# Patient Record
Sex: Female | Born: 1980 | Race: Black or African American | Hispanic: No | Marital: Single | Smoking: Current every day smoker
Health system: Southern US, Community
[De-identification: ages and names within clinical notes are randomized; demographics above are authoritative.]

## PROBLEM LIST (undated history)

## (undated) DIAGNOSIS — Z3490 Encounter for supervision of normal pregnancy, unspecified, unspecified trimester: Secondary | ICD-10-CM

## (undated) DIAGNOSIS — F17200 Nicotine dependence, unspecified, uncomplicated: Secondary | ICD-10-CM

## (undated) DIAGNOSIS — O09529 Supervision of elderly multigravida, unspecified trimester: Secondary | ICD-10-CM

## (undated) DIAGNOSIS — D649 Anemia, unspecified: Secondary | ICD-10-CM

## (undated) HISTORY — DX: Supervision of elderly multigravida, unspecified trimester: O09.529

## (undated) HISTORY — DX: Encounter for supervision of normal pregnancy, unspecified, unspecified trimester: Z34.90

## (undated) HISTORY — DX: Nicotine dependence, unspecified, uncomplicated: F17.200

---

## 2015-11-28 NOTE — L&D Delivery Note (Signed)
Delivery Note At 8:00 AM a viable female was delivered via Vaginal, Spontaneous Delivery (Presentation: ;LOA  ).  APGAR: 8, 9; weight  Pending  After 2 minutes, the cord was clamped and cut. 40 units of pitocin diluted in 1000cc LR was infused rapidly IV.  The placenta separated spontaneously and delivered via CCT and maternal pushing effort.  It was inspected and appears to be intact with a 3 VC.  Marland Kitchen.    Anesthesia: None  Episiotomy:   Lacerations: 1st degree;Vaginal Suture Repair: 2.0 vicryl Est. Blood Loss (mL):  200  Mom to postpartum.  Baby to Couplet care / Skin to Skin.  All of the above by Dr. Lavetta Nielsen Gofa under my supervision  CRESENZO-DISHMAN,Patriece Archbold 05/26/2016, 8:19 AM

## 2016-02-10 ENCOUNTER — Ambulatory Visit (INDEPENDENT_AMBULATORY_CARE_PROVIDER_SITE_OTHER): Payer: Medicaid Other | Admitting: Adult Health

## 2016-02-10 ENCOUNTER — Encounter: Payer: Self-pay | Admitting: Adult Health

## 2016-02-10 VITALS — BP 100/72 | HR 104 | Ht 65.0 in | Wt 139.0 lb

## 2016-02-10 DIAGNOSIS — F17200 Nicotine dependence, unspecified, uncomplicated: Secondary | ICD-10-CM | POA: Insufficient documentation

## 2016-02-10 DIAGNOSIS — N926 Irregular menstruation, unspecified: Secondary | ICD-10-CM | POA: Diagnosis not present

## 2016-02-10 DIAGNOSIS — O09529 Supervision of elderly multigravida, unspecified trimester: Secondary | ICD-10-CM

## 2016-02-10 DIAGNOSIS — Z3201 Encounter for pregnancy test, result positive: Secondary | ICD-10-CM

## 2016-02-10 DIAGNOSIS — Z363 Encounter for antenatal screening for malformations: Secondary | ICD-10-CM

## 2016-02-10 DIAGNOSIS — Z720 Tobacco use: Secondary | ICD-10-CM | POA: Diagnosis not present

## 2016-02-10 DIAGNOSIS — O09523 Supervision of elderly multigravida, third trimester: Secondary | ICD-10-CM

## 2016-02-10 DIAGNOSIS — Z3490 Encounter for supervision of normal pregnancy, unspecified, unspecified trimester: Secondary | ICD-10-CM

## 2016-02-10 HISTORY — DX: Encounter for supervision of normal pregnancy, unspecified, unspecified trimester: Z34.90

## 2016-02-10 HISTORY — DX: Nicotine dependence, unspecified, uncomplicated: F17.200

## 2016-02-10 HISTORY — DX: Supervision of elderly multigravida, unspecified trimester: O09.529

## 2016-02-10 LAB — POCT URINE PREGNANCY: PREG TEST UR: POSITIVE — AB

## 2016-02-10 MED ORDER — PRENATAL PLUS 27-1 MG PO TABS: 1.0000 | ORAL_TABLET | Freq: Every day | ORAL | Status: AC

## 2016-02-10 NOTE — Patient Instructions (Signed)

## 2016-02-10 NOTE — Progress Notes (Signed)
Subjective:     Patient ID: Makayla Colon, female   DOB: Apr 18, 1981, 35 y.o.   MRN: [id number]  HPI Makayla Colon is a 35 year old black female, single, in for UPT, not sure when LMP was, has felt movement since February.Has had 1 PTD and 2 term deliveries. [place] and has no prenatal care.  Review of Systems Patient denies any headaches, hearing loss, fatigue, blurred vision, shortness of breath, chest pain, abdominal pain, problems with bowel movements, urination, or intercourse. No joint pain or mood swings.See HPI for positives. Reviewed past medical,surgical, social and family history. Reviewed medications and allergies.     Objective:   Physical Exam  BP 100/72 mmHg  Pulse 104  Ht 5\' 5"  (1.651 m)  Wt 139 lb (63.05 kg)  BMI 23.13 kg/m2 UPT +, ?lMP, but has felt movement since February, Skin warm and dry. Neck: mid line trachea, normal thyroid, good ROM, no lymphadenopathy noted. Lungs: clear to ausculation bilaterally. Cardiovascular: regular rate and rhythm.FH 30 cm and FHR 145 by doppler, US shows active baby, with +FHM and is a boy, femur length 4.5 cm, has 3 vessel cord and good fluid volume,will get back in am for anatomy and dating US. Decrease cigarettes.    Assessment:     UPT + Pregnant Smoker AMA  Late presentation     Plan:     Rx prenatal plus #30 take 1 daily with 11 refills Return in 1 day for anatomy and dating US Review handout on third trimester

## 2016-02-11 ENCOUNTER — Ambulatory Visit (INDEPENDENT_AMBULATORY_CARE_PROVIDER_SITE_OTHER): Payer: Medicaid Other

## 2016-02-11 DIAGNOSIS — O321XX1 Maternal care for breech presentation, fetus 1: Secondary | ICD-10-CM

## 2016-02-11 DIAGNOSIS — Z363 Encounter for antenatal screening for malformations: Secondary | ICD-10-CM

## 2016-02-11 DIAGNOSIS — Z3A25 25 weeks gestation of pregnancy: Secondary | ICD-10-CM

## 2016-02-11 DIAGNOSIS — Z36 Encounter for antenatal screening of mother: Secondary | ICD-10-CM | POA: Diagnosis not present

## 2016-02-11 NOTE — Progress Notes (Signed)
US 24+5 wks,efw 728g,svp of fluid 4.5cm,normal ov's bilat,breech,fhr 140 bpm,cx 3.8 cm,left lateral pl,anatomy complete no obvious abn seen

## 2016-02-23 ENCOUNTER — Encounter: Payer: Self-pay | Admitting: Advanced Practice Midwife

## 2016-02-23 ENCOUNTER — Ambulatory Visit (INDEPENDENT_AMBULATORY_CARE_PROVIDER_SITE_OTHER): Payer: Medicaid Other | Admitting: Advanced Practice Midwife

## 2016-02-23 ENCOUNTER — Other Ambulatory Visit (HOSPITAL_COMMUNITY)
Admission: RE | Admit: 2016-02-23 | Discharge: 2016-02-23 | Disposition: A | Discharge status: Home / Self Care | Payer: Medicaid Other | Source: Ambulatory Visit | Attending: Advanced Practice Midwife | Admitting: Advanced Practice Midwife

## 2016-02-23 VITALS — BP 92/68 | HR 108 | Wt 140.5 lb

## 2016-02-23 DIAGNOSIS — Z0283 Encounter for blood-alcohol and blood-drug test: Secondary | ICD-10-CM

## 2016-02-23 DIAGNOSIS — Z3492 Encounter for supervision of normal pregnancy, unspecified, second trimester: Secondary | ICD-10-CM

## 2016-02-23 DIAGNOSIS — Z1389 Encounter for screening for other disorder: Secondary | ICD-10-CM

## 2016-02-23 DIAGNOSIS — Z3490 Encounter for supervision of normal pregnancy, unspecified, unspecified trimester: Secondary | ICD-10-CM | POA: Insufficient documentation

## 2016-02-23 DIAGNOSIS — Z01419 Encounter for gynecological examination (general) (routine) without abnormal findings: Secondary | ICD-10-CM | POA: Diagnosis present

## 2016-02-23 DIAGNOSIS — Z1151 Encounter for screening for human papillomavirus (HPV): Secondary | ICD-10-CM | POA: Diagnosis present

## 2016-02-23 DIAGNOSIS — Z369 Encounter for antenatal screening, unspecified: Secondary | ICD-10-CM

## 2016-02-23 DIAGNOSIS — O09523 Supervision of elderly multigravida, third trimester: Secondary | ICD-10-CM

## 2016-02-23 DIAGNOSIS — Z331 Pregnant state, incidental: Secondary | ICD-10-CM

## 2016-02-23 LAB — POCT URINALYSIS DIPSTICK
KETONES UA: NEGATIVE
Leukocytes, UA: NEGATIVE
Nitrite, UA: NEGATIVE
RBC UA: NEGATIVE

## 2016-02-23 NOTE — Patient Instructions (Signed)
Safe Medications in Pregnancy   Acne: Benzoyl Peroxide Salicylic Acid  Backache/Headache: Tylenol: 2 regular strength every 4 hours OR              2 Extra strength every 6 hours  Colds/Coughs/Allergies: Benadryl (alcohol free) 25 mg every 6 hours as needed Breath right strips Claritin Cepacol throat lozenges Chloraseptic throat spray Cold-Eeze- up to three times per day Cough drops, alcohol free Flonase (by prescription only) Guaifenesin Mucinex Robitussin DM (plain only, alcohol free) Saline nasal spray/drops Sudafed (pseudoephedrine) & Actifed ** use only after [redacted] weeks gestation and if you do not have high blood pressure Tylenol Vicks Vaporub Zinc lozenges Zyrtec   Constipation: Colace Ducolax suppositories Fleet enema Glycerin suppositories Metamucil Milk of magnesia Miralax Senokot Smooth move tea  Diarrhea: Kaopectate Imodium A-D  *NO pepto Bismol  Hemorrhoids: Anusol Anusol HC Preparation H Tucks  Indigestion: Tums Maalox Mylanta Zantac  Pepcid  Insomnia: Benadryl (alcohol free) 25mg every 6 hours as needed Tylenol PM Unisom, no Gelcaps  Leg Cramps: Tums MagGel  Nausea/Vomiting:  Bonine Dramamine Emetrol Ginger extract Sea bands Meclizine  Nausea medication to take during pregnancy:  Unisom (doxylamine succinate 25 mg tablets) Take one tablet daily at bedtime. If symptoms are not adequately controlled, the dose can be increased to a maximum recommended dose of two tablets daily (1/2 tablet in the morning, 1/2 tablet mid-afternoon and one at bedtime). Vitamin B6 100mg tablets. Take one tablet twice a day (up to 200 mg per day).  Skin Rashes: Aveeno products Benadryl cream or 25mg every 6 hours as needed Calamine Lotion 1% cortisone cream  Yeast infection: Gyne-lotrimin 7 Monistat 7   **If taking multiple medications, please check labels to avoid duplicating the same active ingredients **take medication as directed on  the label ** Do not exceed 4000 mg of tylenol in 24 hours **Do not take medications that contain aspirin or ibuprofen   1. Before your test, do not eat or drink anything for 8-10 hours prior to your  appointment (a small amount of water is allowed and you may take any medicines you normally take). Be sure to drink lots of water the day before. 2. When you arrive, your blood will be drawn for a 'fasting' blood sugar level.  Then you will be given a sweetened carbonated beverage to drink. You should  complete drinking this beverage within five minutes. After finishing the  beverage, you will have your blood drawn exactly 1 and 2 hours later. Having  your blood drawn on time is an important part of this test. A total of three blood  samples will be done. 3. The test takes approximately 2  hours. During the test, do not have anything to  eat or drink. Do not smoke, chew gum (not even sugarless gum) or use breath mints.  4. During the test you should remain close by and seated as much as possible and  avoid walking around. You may want to bring a book or something else to  occupy your time.  5. After your test, you may eat and drink as normal. You may want to bring a snack  to eat after the test is finished. Your provider will advise you as to the results of  this test and any follow-up if necessary  If your sugar test is positive for gestational diabetes, you will be given an phone call and further instructions discussed. If you wish to know all of your test results before your next   next appointment, feel free to call the office, or look up your test results on Mychart.  (The range that the lab uses for normal values of the sugar test are not necessarily the range that is used for pregnant women; if your results are within the normal range, they are definitely normal.  However, if a value is deemed "high" by the lab, it may not be too high for a pregnant woman.  We will need to discuss the results if  your value(s) fall in the "high" category).     Tdap Vaccine  It is recommended that you get the Tdap vaccine during the third trimester of EACH pregnancy to help protect your baby from getting pertussis (whooping cough)  27-36 weeks is the BEST time to do this so that you can pass the protection on to your baby. During pregnancy is better than after pregnancy, but if you are unable to get it during pregnancy it will be offered at the hospital.  You can get this vaccine at the health department or your family doctor, as well as some pharmacies.  Everyone who will be around your baby should also be up-to-date on their vaccines. Adults (who are not pregnant) only need 1 dose of Tdap during adulthood.      

## 2016-02-23 NOTE — Progress Notes (Signed)
Subjective:    Makayla Colon is a [id number] 3555w3d being seen today for her first oMarja Kaysbstetrical visit.  Her obstetrical history is significant for intrauterine growth restriction (IUGR), smoker and PTD @ 32 weeks after PPROM w/ 1st baby.  Too late for 17p.  Pregnancy history fully reviewed.  Patient reports some hip pain, but otherwise feels OK.  She did not know she was pregnat until recently.  [place] to help mom w/ aging grandparents.  FOB is b/t 2 different Delta men.Ceasar Mons.  Filed Vitals:   02/23/16 1116  BP: 92/68  Pulse: 108  Weight: 140 lb 8 oz (63.73 kg)    HISTORY: OB History  Gravida Para Term Preterm AB SAB TAB Ectopic Multiple Living  7 3 2 1 3 2 1  0 0 3    # Outcome Date GA Lbr Len/2nd Weight Sex Delivery Anes PTL Lv  7 Current           6 Term 02/03/13   5 lb 4 oz (2.381 kg) F   N Y  5 Term 10/13/05 8554w0d  6 lb 7 oz (2.92 kg) F Vag-Spont  N Y  4 Preterm 12/13/02 8448w0d  3 lb 1 oz (1.389 kg) M   Y Y  3 SAB           2 SAB           1 TAB              Past Medical History  Diagnosis Date  . Pregnant 02/10/2016  . Smoker 02/10/2016  . AMA (advanced maternal age) multigravida 35+ 02/10/2016   History reviewed. No pertinent past surgical history. Family History  Problem Relation Age of Onset  . Hypertension Mother   . Cancer Mother   . Asthma Son   . Cancer Maternal Grandmother   . Other Maternal Grandmother     blood clots  . Hypertension Maternal Grandmother   . Cancer Maternal Grandfather      Exam       Pelvic Exam:    Perineum: Normal Perineum   Vulva: normal   Vagina:  normal mucosa, normal discharge, no palpable nodules   Uterus Normal, Gravid, FH: 23     Cervix: normal   Adnexa: Not palpable   Urinary:  urethral meatus normal    System:     Skin: normal coloration and turgor, no rashes.  Large hairy nevis on right forearm    Neurologic: oriented, normal, normal mood   Extremities: normal strength, tone, and muscle mass    HEENT PERRLA   Mouth/Teeth mucous membranes moist, normal dentition   Neck supple and no masses   Cardiovascular: regular rate and rhythm   Respiratory:  appears well, vitals normal, no respiratory distress, acyanotic   Abdomen: soft, non-tender;  FHR: 130          Assessment:    Pregnancy: [id number] Patient Active Problem List   Diagnosis Date Noted  . Supervision of normal pregnancy 02/23/2016  . Pregnant 02/10/2016  . Smoker 02/10/2016  . AMA (advanced maternal age) multigravida 35+ 02/10/2016        Plan:     Initial labs drawn. Continue prenatal vitamins  Problem list reviewed and updated  Reviewed n/v relief measures and warning s/s to report  Reviewed recommended weight gain based on pre-gravid BMI  Encouraged well-balanced diet Genetic Screening discussed too late: Marland Kitchen.  Ultrasound discussed; fetal survey: results reviewed.  Return in about 2 weeks (  around 03/08/2016) for PN2/LROB.  CRESENZO-DISHMAN,Makayla Colon 02/23/2016

## 2016-02-24 ENCOUNTER — Telehealth: Payer: Self-pay | Admitting: *Deleted

## 2016-02-24 LAB — URINE CULTURE: ORGANISM ID, BACTERIA: NO GROWTH

## 2016-02-24 LAB — CBC
Hematocrit: 33.2 % — ABNORMAL LOW (ref 34.0–46.6)
Hemoglobin: 11 g/dL — ABNORMAL LOW (ref 11.1–15.9)
MCH: 30.8 pg (ref 26.6–33.0)
MCHC: 33.1 g/dL (ref 31.5–35.7)
MCV: 93 fL (ref 79–97)
Platelets: 246 10*3/uL (ref 150–379)
RBC: 3.57 x10E6/uL — ABNORMAL LOW (ref 3.77–5.28)
RDW: 15 % (ref 12.3–15.4)
WBC: 12 10*3/uL — ABNORMAL HIGH (ref 3.4–10.8)

## 2016-02-24 LAB — RPR: RPR: NONREACTIVE

## 2016-02-24 LAB — PMP SCREEN PROFILE (10S), URINE
Amphetamine Screen, Ur: NEGATIVE ng/mL
BARBITURATE SCRN UR: NEGATIVE ng/mL
BENZODIAZEPINE SCREEN, URINE: NEGATIVE ng/mL
CANNABINOIDS UR QL SCN: NEGATIVE ng/mL
Cocaine(Metab.)Screen, Urine: NEGATIVE ng/mL
Creatinine(Crt), U: 147.8 mg/dL (ref 20.0–300.0)
Methadone Scn, Ur: NEGATIVE ng/mL
OPIATE SCRN UR: NEGATIVE ng/mL
OXYCODONE+OXYMORPHONE UR QL SCN: NEGATIVE ng/mL
PCP Scrn, Ur: NEGATIVE ng/mL
Ph of Urine: 5.7 (ref 4.5–8.9)
Propoxyphene, Screen: NEGATIVE ng/mL

## 2016-02-24 LAB — URINALYSIS, ROUTINE W REFLEX MICROSCOPIC
Bilirubin, UA: NEGATIVE
Ketones, UA: NEGATIVE
Leukocytes, UA: NEGATIVE
NITRITE UA: NEGATIVE
PH UA: 6 (ref 5.0–7.5)
RBC, UA: NEGATIVE
Specific Gravity, UA: 1.028 (ref 1.005–1.030)
UUROB: 0.2 mg/dL (ref 0.2–1.0)

## 2016-02-24 LAB — ANTIBODY SCREEN: ANTIBODY SCREEN: NEGATIVE

## 2016-02-24 LAB — HIV ANTIBODY (ROUTINE TESTING W REFLEX): HIV SCREEN 4TH GENERATION: NONREACTIVE

## 2016-02-24 LAB — VARICELLA ZOSTER ANTIBODY, IGG: Varicella zoster IgG: 349 index (ref >165)

## 2016-02-24 LAB — ABO/RH: RH TYPE: POSITIVE

## 2016-02-24 LAB — HEPATITIS B SURFACE ANTIGEN: HEP B S AG: NEGATIVE

## 2016-02-24 LAB — CYTOLOGY - PAP

## 2016-02-24 LAB — RUBELLA SCREEN: RUBELLA: 1.4 {index} (ref >0.99)

## 2016-02-24 LAB — SICKLE CELL SCREEN: SICKLE CELL SCREEN: NEGATIVE

## 2016-02-24 NOTE — Telephone Encounter (Signed)
Opened phone encounter in error 

## 2016-02-24 NOTE — Telephone Encounter (Signed)
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 Informed pt due date is 05/28/2016, gestational age 37 w 4 d.

## 2016-03-08 ENCOUNTER — Ambulatory Visit (INDEPENDENT_AMBULATORY_CARE_PROVIDER_SITE_OTHER): Payer: Medicaid Other | Admitting: Women's Health

## 2016-03-08 ENCOUNTER — Encounter: Payer: Self-pay | Admitting: Women's Health

## 2016-03-08 ENCOUNTER — Other Ambulatory Visit: Payer: Medicaid Other

## 2016-03-08 VITALS — BP 100/60 | HR 90 | Wt 142.0 lb

## 2016-03-08 DIAGNOSIS — O09899 Supervision of other high risk pregnancies, unspecified trimester: Secondary | ICD-10-CM | POA: Insufficient documentation

## 2016-03-08 DIAGNOSIS — Z369 Encounter for antenatal screening, unspecified: Secondary | ICD-10-CM

## 2016-03-08 DIAGNOSIS — Z331 Pregnant state, incidental: Secondary | ICD-10-CM

## 2016-03-08 DIAGNOSIS — O0930 Supervision of pregnancy with insufficient antenatal care, unspecified trimester: Secondary | ICD-10-CM

## 2016-03-08 DIAGNOSIS — O09893 Supervision of other high risk pregnancies, third trimester: Secondary | ICD-10-CM

## 2016-03-08 DIAGNOSIS — O09523 Supervision of elderly multigravida, third trimester: Secondary | ICD-10-CM

## 2016-03-08 DIAGNOSIS — Z1389 Encounter for screening for other disorder: Secondary | ICD-10-CM

## 2016-03-08 DIAGNOSIS — Z3493 Encounter for supervision of normal pregnancy, unspecified, third trimester: Secondary | ICD-10-CM

## 2016-03-08 DIAGNOSIS — O09213 Supervision of pregnancy with history of pre-term labor, third trimester: Secondary | ICD-10-CM

## 2016-03-08 DIAGNOSIS — O09219 Supervision of pregnancy with history of pre-term labor, unspecified trimester: Secondary | ICD-10-CM

## 2016-03-08 DIAGNOSIS — Z8759 Personal history of other complications of pregnancy, childbirth and the puerperium: Secondary | ICD-10-CM

## 2016-03-08 LAB — POCT URINALYSIS DIPSTICK
GLUCOSE UA: NEGATIVE
Ketones, UA: NEGATIVE
Leukocytes, UA: NEGATIVE
Nitrite, UA: NEGATIVE
Protein, UA: NEGATIVE
RBC UA: NEGATIVE

## 2016-03-08 NOTE — Progress Notes (Addendum)
Low-risk OB appointment [id number] 5143w3d Estimated Date of Delivery: 05/28/16 Wt 142 lb (64.411 kg)  LMP  (LMP Unknown)  BP, weight, and urine reviewed.  Refer to obstetrical flow sheet for FH & FHR.  Reports good fm.  Denies regular uc's, lof, vb, or uti s/s. No complaints. Wants in-hospital BTL, declines interval salpingectomy. Discussed risks/benefits, consent signed today.  Reviewed ptl s/s, fkc. Recommended Tdap at HD/PCP per CDC guidelines.  Plan:  Continue routine obstetrical care  F/U in 4wks for OB appointment and bpp/efw/afi u/s for ama, h/o iugr PN2 today

## 2016-03-08 NOTE — Patient Instructions (Signed)

## 2016-03-09 LAB — CBC
HEMATOCRIT: 33.3 % — AB (ref 34.0–46.6)
HEMOGLOBIN: 11.3 g/dL (ref 11.1–15.9)
MCH: 32 pg (ref 26.6–33.0)
MCHC: 33.9 g/dL (ref 31.5–35.7)
MCV: 94 fL (ref 79–97)
Platelets: 230 10*3/uL (ref 150–379)
RBC: 3.53 x10E6/uL — AB (ref 3.77–5.28)
RDW: 15 % (ref 12.3–15.4)
WBC: 12.1 10*3/uL — ABNORMAL HIGH (ref 3.4–10.8)

## 2016-03-09 LAB — RPR: RPR: NONREACTIVE

## 2016-03-09 LAB — HIV ANTIBODY (ROUTINE TESTING W REFLEX): HIV SCREEN 4TH GENERATION: NONREACTIVE

## 2016-03-09 LAB — GLUCOSE TOLERANCE, 2 HOURS W/ 1HR
GLUCOSE, 1 HOUR: 146 mg/dL (ref 65–179)
GLUCOSE, 2 HOUR: 111 mg/dL (ref 65–152)
Glucose, Fasting: 84 mg/dL (ref 65–91)

## 2016-03-09 LAB — ANTIBODY SCREEN: Antibody Screen: NEGATIVE

## 2016-04-06 ENCOUNTER — Ambulatory Visit (INDEPENDENT_AMBULATORY_CARE_PROVIDER_SITE_OTHER): Payer: Medicaid Other

## 2016-04-06 ENCOUNTER — Encounter: Payer: Self-pay | Admitting: Advanced Practice Midwife

## 2016-04-06 ENCOUNTER — Ambulatory Visit (INDEPENDENT_AMBULATORY_CARE_PROVIDER_SITE_OTHER): Payer: Medicaid Other | Admitting: Advanced Practice Midwife

## 2016-04-06 VITALS — BP 98/62 | HR 88 | Wt 146.0 lb

## 2016-04-06 DIAGNOSIS — O09523 Supervision of elderly multigravida, third trimester: Secondary | ICD-10-CM

## 2016-04-06 DIAGNOSIS — Z3483 Encounter for supervision of other normal pregnancy, third trimester: Secondary | ICD-10-CM

## 2016-04-06 DIAGNOSIS — O09213 Supervision of pregnancy with history of pre-term labor, third trimester: Secondary | ICD-10-CM

## 2016-04-06 DIAGNOSIS — Z3493 Encounter for supervision of normal pregnancy, unspecified, third trimester: Secondary | ICD-10-CM

## 2016-04-06 DIAGNOSIS — Z3A33 33 weeks gestation of pregnancy: Secondary | ICD-10-CM

## 2016-04-06 DIAGNOSIS — Z1389 Encounter for screening for other disorder: Secondary | ICD-10-CM

## 2016-04-06 DIAGNOSIS — Z331 Pregnant state, incidental: Secondary | ICD-10-CM

## 2016-04-06 DIAGNOSIS — Z8759 Personal history of other complications of pregnancy, childbirth and the puerperium: Secondary | ICD-10-CM

## 2016-04-06 LAB — POCT URINALYSIS DIPSTICK
Blood, UA: NEGATIVE
GLUCOSE UA: NEGATIVE
KETONES UA: NEGATIVE
Leukocytes, UA: NEGATIVE
Nitrite, UA: NEGATIVE
Protein, UA: NEGATIVE

## 2016-04-06 NOTE — Progress Notes (Addendum)
[id number] 6581w4d Estimated Date of Delivery: 05/28/16  Blood pressure 98/62, pulse 88, weight 146 lb (66.225 kg).   BP weight and urine results all reviewed and noted.  Please refer to the obstetrical flow sheet for the fundal height and fetal heart rate documentation:US 32+4 wks,cephalic,left lat pl gr 0,normal ov's bilat,fhr 142 bpm,afi 13 cm,RI .64,.54,EFW 2128 g 55%,BPP 8/8  Patient reports good fetal movement, denies any bleeding and no rupture of membranes symptoms or regular contractions.  Has some pain in right pelvic area c /w round ligament pain.  Patient is without complaints. All questions were answered.  Orders Placed This Encounter  Procedures  . POCT urinalysis dipstick    Plan:  Continued routine obstetrical care,   Return in about 2 weeks (around 04/20/2016) for LROB.

## 2016-04-06 NOTE — Progress Notes (Signed)
US 32+4 wks,cephalic,left lat pl gr 0,normal ov's bilat,fhr 142 bpm,afi 13 cm,RI .64,.54,EFW 2128 g 55%,BPP 8/8

## 2016-04-06 NOTE — Patient Instructions (Signed)

## 2016-04-20 ENCOUNTER — Ambulatory Visit (INDEPENDENT_AMBULATORY_CARE_PROVIDER_SITE_OTHER): Payer: Medicaid Other | Admitting: Advanced Practice Midwife

## 2016-04-20 ENCOUNTER — Encounter: Payer: Self-pay | Admitting: Advanced Practice Midwife

## 2016-04-20 VITALS — BP 100/70 | HR 84 | Wt 147.3 lb

## 2016-04-20 DIAGNOSIS — Z3A35 35 weeks gestation of pregnancy: Secondary | ICD-10-CM

## 2016-04-20 DIAGNOSIS — Z1389 Encounter for screening for other disorder: Secondary | ICD-10-CM

## 2016-04-20 DIAGNOSIS — O09523 Supervision of elderly multigravida, third trimester: Secondary | ICD-10-CM

## 2016-04-20 DIAGNOSIS — Z8759 Personal history of other complications of pregnancy, childbirth and the puerperium: Secondary | ICD-10-CM

## 2016-04-20 DIAGNOSIS — Z3493 Encounter for supervision of normal pregnancy, unspecified, third trimester: Secondary | ICD-10-CM

## 2016-04-20 DIAGNOSIS — Z3483 Encounter for supervision of other normal pregnancy, third trimester: Secondary | ICD-10-CM

## 2016-04-20 DIAGNOSIS — Z331 Pregnant state, incidental: Secondary | ICD-10-CM

## 2016-04-20 LAB — POCT URINALYSIS DIPSTICK
Blood, UA: NEGATIVE
Glucose, UA: NEGATIVE
KETONES UA: NEGATIVE
LEUKOCYTES UA: NEGATIVE
NITRITE UA: NEGATIVE

## 2016-04-20 NOTE — Progress Notes (Signed)
[id number] 6132w4d Estimated Date of Delivery: 05/28/16  Blood pressure 100/70, pulse 84, weight 147 lb 4.8 oz (66.815 kg).   BP weight and urine results all reviewed and noted.  Please refer to the obstetrical flow sheet for the fundal height and fetal heart rate documentation:  Patient reports good fetal movement, denies any bleeding and no rupture of membranes symptoms or regular contractions. Patient is without complaints. All questions were answered.  Orders Placed This Encounter  Procedures  . US OB Follow Up  . POCT urinalysis dipstick    Plan:  Continued routine obstetrical care,   Return in about 2 weeks (around 05/04/2016) for LROB, US:EFW. D/T hx IUGR

## 2016-05-04 ENCOUNTER — Encounter: Payer: Self-pay | Admitting: Obstetrics & Gynecology

## 2016-05-04 ENCOUNTER — Ambulatory Visit (INDEPENDENT_AMBULATORY_CARE_PROVIDER_SITE_OTHER): Payer: Medicaid Other | Admitting: Obstetrics & Gynecology

## 2016-05-04 ENCOUNTER — Ambulatory Visit (INDEPENDENT_AMBULATORY_CARE_PROVIDER_SITE_OTHER): Payer: Medicaid Other

## 2016-05-04 VITALS — BP 100/70 | HR 88 | Wt 143.0 lb

## 2016-05-04 DIAGNOSIS — Z1159 Encounter for screening for other viral diseases: Secondary | ICD-10-CM

## 2016-05-04 DIAGNOSIS — Z3483 Encounter for supervision of other normal pregnancy, third trimester: Secondary | ICD-10-CM

## 2016-05-04 DIAGNOSIS — Z118 Encounter for screening for other infectious and parasitic diseases: Secondary | ICD-10-CM

## 2016-05-04 DIAGNOSIS — Z3493 Encounter for supervision of normal pregnancy, unspecified, third trimester: Secondary | ICD-10-CM

## 2016-05-04 DIAGNOSIS — Z1389 Encounter for screening for other disorder: Secondary | ICD-10-CM

## 2016-05-04 DIAGNOSIS — Z331 Pregnant state, incidental: Secondary | ICD-10-CM

## 2016-05-04 DIAGNOSIS — Z8759 Personal history of other complications of pregnancy, childbirth and the puerperium: Secondary | ICD-10-CM

## 2016-05-04 DIAGNOSIS — Z3685 Encounter for antenatal screening for Streptococcus B: Secondary | ICD-10-CM

## 2016-05-04 DIAGNOSIS — Z3A37 37 weeks gestation of pregnancy: Secondary | ICD-10-CM

## 2016-05-04 DIAGNOSIS — O09523 Supervision of elderly multigravida, third trimester: Secondary | ICD-10-CM | POA: Diagnosis not present

## 2016-05-04 LAB — POCT URINALYSIS DIPSTICK
Blood, UA: NEGATIVE
Glucose, UA: NEGATIVE
KETONES UA: NEGATIVE
Leukocytes, UA: NEGATIVE
Nitrite, UA: NEGATIVE
PROTEIN UA: NEGATIVE

## 2016-05-04 NOTE — Progress Notes (Signed)
[id number] 5775w4d Estimated Date of Delivery: 05/28/16  Blood pressure 100/70, pulse 88, weight 143 lb (64.864 kg).   BP weight and urine results all reviewed and noted.  Please refer to the obstetrical flow sheet for the fundal height and fetal heart rate documentation:  Patient reports good fetal movement, denies any bleeding and no rupture of membranes symptoms or regular contractions. Patient is without complaints. All questions were answered.  Orders Placed This Encounter  Procedures  . GC/Chlamydia Probe Amp  . Strep Gp B NAA  . POCT urinalysis dipstick    Plan:  Continued routine obstetrical care, sonogram is normal with EFW 36%  Return in about 1 week (around 05/11/2016) for LROB.

## 2016-05-04 NOTE — Progress Notes (Signed)
US 36+4 wks,cephalic,left lat pl gr 1,normal ov's bilat,fhr 136 bpm,afi 15 cm,efw 2793 g 36%

## 2016-05-05 LAB — OB RESULTS CONSOLE GBS: GBS: NEGATIVE

## 2016-05-06 LAB — GC/CHLAMYDIA PROBE AMP
CHLAMYDIA, DNA PROBE: NEGATIVE
Neisseria gonorrhoeae by PCR: NEGATIVE

## 2016-05-06 LAB — STREP GP B NAA: STREP GROUP B AG: NEGATIVE

## 2016-05-11 ENCOUNTER — Ambulatory Visit (INDEPENDENT_AMBULATORY_CARE_PROVIDER_SITE_OTHER): Payer: Medicaid Other | Admitting: Advanced Practice Midwife

## 2016-05-11 VITALS — BP 94/72 | HR 98 | Wt 146.0 lb

## 2016-05-11 DIAGNOSIS — Z331 Pregnant state, incidental: Secondary | ICD-10-CM

## 2016-05-11 DIAGNOSIS — Z3493 Encounter for supervision of normal pregnancy, unspecified, third trimester: Secondary | ICD-10-CM

## 2016-05-11 DIAGNOSIS — Z1389 Encounter for screening for other disorder: Secondary | ICD-10-CM

## 2016-05-11 LAB — POCT URINALYSIS DIPSTICK
GLUCOSE UA: NEGATIVE
Ketones, UA: NEGATIVE
NITRITE UA: NEGATIVE

## 2016-05-11 NOTE — Patient Instructions (Signed)

## 2016-05-11 NOTE — Progress Notes (Addendum)
[id number] 5684w4d Estimated Date of Delivery: 05/28/16  Blood pressure 94/72, pulse 98, weight 146 lb (66.225 kg).   BP weight and urine results all reviewed and noted.  Please refer to the obstetrical flow sheet for the fundal height and fetal heart rate documentation:  Patient reports good fetal movement, denies any bleeding and no rupture of membranes symptoms or regular contractions. Patient is without complaints. All questions were answered.  Orders Placed This Encounter  Procedures  . POCT urinalysis dipstick    Plan:  Continued routine obstetrical care,   Return in about 1 week (around 05/18/2016) for LROB.

## 2016-05-18 ENCOUNTER — Ambulatory Visit (INDEPENDENT_AMBULATORY_CARE_PROVIDER_SITE_OTHER): Payer: Medicaid Other | Admitting: Advanced Practice Midwife

## 2016-05-18 VITALS — BP 110/80 | HR 94 | Wt 148.0 lb

## 2016-05-18 DIAGNOSIS — Z3493 Encounter for supervision of normal pregnancy, unspecified, third trimester: Secondary | ICD-10-CM

## 2016-05-18 DIAGNOSIS — Z331 Pregnant state, incidental: Secondary | ICD-10-CM

## 2016-05-18 DIAGNOSIS — Z1389 Encounter for screening for other disorder: Secondary | ICD-10-CM

## 2016-05-18 LAB — POCT URINALYSIS DIPSTICK
GLUCOSE UA: NEGATIVE
Ketones, UA: NEGATIVE
Leukocytes, UA: NEGATIVE
NITRITE UA: NEGATIVE
PROTEIN UA: NEGATIVE

## 2016-05-18 NOTE — Progress Notes (Signed)
[id number] 2624w4d Estimated Date of Delivery: 05/28/16  Blood pressure 110/80, pulse 94, weight 148 lb (67.132 kg).   BP weight and urine results all reviewed and noted.  Please refer to the obstetrical flow sheet for the fundal height and fetal heart rate documentation:  Patient reports good fetal movement, denies any bleeding and no rupture of membranes symptoms or regular contractions. Patient is without complaints. All questions were answered.  Orders Placed This Encounter  Procedures  . POCT urinalysis dipstick    Plan:  Continued routine obstetrical care,   Return in about 1 week (around 05/25/2016) for LROB.

## 2016-05-18 NOTE — Patient Instructions (Signed)

## 2016-05-25 ENCOUNTER — Ambulatory Visit (INDEPENDENT_AMBULATORY_CARE_PROVIDER_SITE_OTHER): Payer: Medicaid Other | Admitting: Obstetrics & Gynecology

## 2016-05-25 VITALS — BP 100/72 | HR 96 | Wt 143.0 lb

## 2016-05-25 DIAGNOSIS — Z3483 Encounter for supervision of other normal pregnancy, third trimester: Secondary | ICD-10-CM | POA: Diagnosis not present

## 2016-05-25 DIAGNOSIS — Z3A40 40 weeks gestation of pregnancy: Secondary | ICD-10-CM

## 2016-05-25 DIAGNOSIS — Z3493 Encounter for supervision of normal pregnancy, unspecified, third trimester: Secondary | ICD-10-CM

## 2016-05-25 DIAGNOSIS — Z331 Pregnant state, incidental: Secondary | ICD-10-CM

## 2016-05-25 DIAGNOSIS — Z1389 Encounter for screening for other disorder: Secondary | ICD-10-CM

## 2016-05-25 LAB — POCT URINALYSIS DIPSTICK
GLUCOSE UA: NEGATIVE
Ketones, UA: NEGATIVE
Leukocytes, UA: NEGATIVE
NITRITE UA: NEGATIVE
RBC UA: NEGATIVE

## 2016-05-25 NOTE — Progress Notes (Signed)
[id number] 5460w4d Estimated Date of Delivery: 05/28/16  Blood pressure 100/72, pulse 96, weight 143 lb (64.864 kg).   BP weight and urine results all reviewed and noted.  Please refer to the obstetrical flow sheet for the fundal height and fetal heart rate documentation:  Patient reports good fetal movement, denies any bleeding and no rupture of membranes symptoms or regular contractions. Patient is without complaints. All questions were answered.  Orders Placed This Encounter  Procedures  . POCT urinalysis dipstick    Plan:  Continued routine obstetrical care,   No Follow-up on file.

## 2016-05-26 ENCOUNTER — Encounter (HOSPITAL_COMMUNITY): Payer: Self-pay

## 2016-05-26 ENCOUNTER — Encounter: Payer: Medicaid Other | Admitting: Obstetrics & Gynecology

## 2016-05-26 ENCOUNTER — Inpatient Hospital Stay (HOSPITAL_COMMUNITY)
Admission: AD | Admit: 2016-05-26 | Discharge: 2016-05-28 | DRG: 767 | Disposition: A | Discharge status: Home / Self Care | Payer: Medicaid Other | Source: Ambulatory Visit | Attending: Family Medicine | Admitting: Family Medicine

## 2016-05-26 DIAGNOSIS — F17210 Nicotine dependence, cigarettes, uncomplicated: Secondary | ICD-10-CM | POA: Diagnosis present

## 2016-05-26 DIAGNOSIS — Z3A39 39 weeks gestation of pregnancy: Secondary | ICD-10-CM | POA: Diagnosis not present

## 2016-05-26 DIAGNOSIS — Z8249 Family history of ischemic heart disease and other diseases of the circulatory system: Secondary | ICD-10-CM

## 2016-05-26 DIAGNOSIS — Z302 Encounter for sterilization: Secondary | ICD-10-CM | POA: Diagnosis not present

## 2016-05-26 DIAGNOSIS — IMO0001 Reserved for inherently not codable concepts without codable children: Secondary | ICD-10-CM

## 2016-05-26 DIAGNOSIS — Z825 Family history of asthma and other chronic lower respiratory diseases: Secondary | ICD-10-CM

## 2016-05-26 DIAGNOSIS — O99334 Smoking (tobacco) complicating childbirth: Secondary | ICD-10-CM | POA: Diagnosis present

## 2016-05-26 LAB — CBC
HEMATOCRIT: 38.8 % (ref 36.0–46.0)
Hemoglobin: 13.5 g/dL (ref 12.0–15.0)
MCH: 32.5 pg (ref 26.0–34.0)
MCHC: 34.8 g/dL (ref 30.0–36.0)
MCV: 93.3 fL (ref 78.0–100.0)
Platelets: 183 10*3/uL (ref 150–400)
RBC: 4.16 MIL/uL (ref 3.87–5.11)
RDW: 13.7 % (ref 11.5–15.5)
WBC: 12.8 10*3/uL — AB (ref 4.0–10.5)

## 2016-05-26 LAB — TYPE AND SCREEN
ABO/RH(D): O POS
ANTIBODY SCREEN: NEGATIVE

## 2016-05-26 LAB — RPR: RPR Ser Ql: NONREACTIVE

## 2016-05-26 MED ORDER — ONDANSETRON HCL 4 MG/2ML IJ SOLN: 4.0000 mg | Freq: Four times a day (QID) | INTRAMUSCULAR | Status: DC | PRN

## 2016-05-26 MED ORDER — PRENATAL MULTIVITAMIN CH
1.0000 | ORAL_TABLET | Freq: Every day | ORAL | Status: DC
Start: 2016-05-26 — End: 2016-05-28
  Administered 2016-05-26 – 2016-05-28 (×3): 1 via ORAL
  Filled 2016-05-26 (×3): qty 1

## 2016-05-26 MED ORDER — MORPHINE SULFATE (PF) 4 MG/ML IV SOLN: 2.0000 mg | Freq: Once | INTRAVENOUS | Status: DC

## 2016-05-26 MED ORDER — OXYCODONE HCL 5 MG PO TABS
5.0000 mg | ORAL_TABLET | ORAL | Status: DC | PRN
  Administered 2016-05-26 – 2016-05-28 (×3): 5 mg via ORAL
  Filled 2016-05-26 (×3): qty 1

## 2016-05-26 MED ORDER — ONDANSETRON HCL 4 MG PO TABS: 4.0000 mg | ORAL_TABLET | ORAL | Status: DC | PRN

## 2016-05-26 MED ORDER — OXYTOCIN BOLUS FROM INFUSION
500.0000 mL | INTRAVENOUS | Status: DC
  Administered 2016-05-26: 500 mL via INTRAVENOUS

## 2016-05-26 MED ORDER — METHYLERGONOVINE MALEATE 0.2 MG/ML IJ SOLN: 0.2000 mg | INTRAMUSCULAR | Status: DC | PRN

## 2016-05-26 MED ORDER — ONDANSETRON HCL 4 MG/2ML IJ SOLN: 4.0000 mg | INTRAMUSCULAR | Status: DC | PRN

## 2016-05-26 MED ORDER — MEASLES, MUMPS & RUBELLA VAC ~~LOC~~ INJ
0.5000 mL | INJECTION | Freq: Once | SUBCUTANEOUS | Status: DC
  Filled 2016-05-26: qty 0.5

## 2016-05-26 MED ORDER — FAMOTIDINE 20 MG PO TABS: 40.0000 mg | ORAL_TABLET | Freq: Once | ORAL | Status: DC

## 2016-05-26 MED ORDER — WITCH HAZEL-GLYCERIN EX PADS: 1.0000 "application " | MEDICATED_PAD | CUTANEOUS | Status: DC | PRN

## 2016-05-26 MED ORDER — SOD CITRATE-CITRIC ACID 500-334 MG/5ML PO SOLN: 30.0000 mL | ORAL | Status: DC | PRN

## 2016-05-26 MED ORDER — BENZOCAINE-MENTHOL 20-0.5 % EX AERO
1.0000 "application " | INHALATION_SPRAY | CUTANEOUS | Status: DC | PRN
  Administered 2016-05-26: 1 via TOPICAL
  Filled 2016-05-26: qty 56

## 2016-05-26 MED ORDER — METOCLOPRAMIDE HCL 10 MG PO TABS: 10.0000 mg | ORAL_TABLET | Freq: Once | ORAL | Status: DC

## 2016-05-26 MED ORDER — LACTATED RINGERS IV SOLN
INTRAVENOUS | Status: DC
  Administered 2016-05-27: 12:00:00 via INTRAVENOUS

## 2016-05-26 MED ORDER — PNEUMOCOCCAL VAC POLYVALENT 25 MCG/0.5ML IJ INJ
0.5000 mL | INJECTION | INTRAMUSCULAR | Status: DC
  Filled 2016-05-26: qty 0.5

## 2016-05-26 MED ORDER — FENTANYL CITRATE (PF) 100 MCG/2ML IJ SOLN: 50.0000 ug | INTRAMUSCULAR | Status: DC | PRN

## 2016-05-26 MED ORDER — METHYLERGONOVINE MALEATE 0.2 MG PO TABS
0.2000 mg | ORAL_TABLET | ORAL | Status: DC | PRN
Start: 2016-05-26 — End: 2016-05-28

## 2016-05-26 MED ORDER — DIBUCAINE 1 % RE OINT: 1.0000 "application " | TOPICAL_OINTMENT | RECTAL | Status: DC | PRN

## 2016-05-26 MED ORDER — FERROUS SULFATE 325 (65 FE) MG PO TABS
325.0000 mg | ORAL_TABLET | Freq: Two times a day (BID) | ORAL | Status: DC
  Administered 2016-05-26 – 2016-05-28 (×3): 325 mg via ORAL
  Filled 2016-05-26 (×3): qty 1

## 2016-05-26 MED ORDER — LIDOCAINE HCL (PF) 1 % IJ SOLN
30.0000 mL | INTRAMUSCULAR | Status: DC | PRN
  Administered 2016-05-26: 30 mL via SUBCUTANEOUS
  Filled 2016-05-26: qty 30

## 2016-05-26 MED ORDER — OXYCODONE HCL 5 MG PO TABS
10.0000 mg | ORAL_TABLET | ORAL | Status: DC | PRN
  Administered 2016-05-27: 10 mg via ORAL
  Filled 2016-05-26: qty 2

## 2016-05-26 MED ORDER — IBUPROFEN 600 MG PO TABS
600.0000 mg | ORAL_TABLET | Freq: Four times a day (QID) | ORAL | Status: DC
  Administered 2016-05-26 – 2016-05-28 (×8): 600 mg via ORAL
  Filled 2016-05-26 (×8): qty 1

## 2016-05-26 MED ORDER — DIPHENHYDRAMINE HCL 25 MG PO CAPS: 25.0000 mg | ORAL_CAPSULE | Freq: Four times a day (QID) | ORAL | Status: DC | PRN

## 2016-05-26 MED ORDER — OXYTOCIN 40 UNITS IN LACTATED RINGERS INFUSION - SIMPLE MED
2.5000 [IU]/h | INTRAVENOUS | Status: DC
  Filled 2016-05-26: qty 1000

## 2016-05-26 MED ORDER — BISACODYL 10 MG RE SUPP: 10.0000 mg | Freq: Every day | RECTAL | Status: DC | PRN

## 2016-05-26 MED ORDER — ZOLPIDEM TARTRATE 5 MG PO TABS: 5.0000 mg | ORAL_TABLET | Freq: Every evening | ORAL | Status: DC | PRN

## 2016-05-26 MED ORDER — FLEET ENEMA 7-19 GM/118ML RE ENEM: 1.0000 | ENEMA | Freq: Every day | RECTAL | Status: DC | PRN

## 2016-05-26 MED ORDER — LACTATED RINGERS IV SOLN
INTRAVENOUS | Status: DC
  Administered 2016-05-26: 08:00:00 via INTRAVENOUS

## 2016-05-26 MED ORDER — ACETAMINOPHEN 325 MG PO TABS: 650.0000 mg | ORAL_TABLET | ORAL | Status: DC | PRN

## 2016-05-26 MED ORDER — COCONUT OIL OIL: 1.0000 "application " | TOPICAL_OIL | Status: DC | PRN

## 2016-05-26 MED ORDER — SIMETHICONE 80 MG PO CHEW
80.0000 mg | CHEWABLE_TABLET | ORAL | Status: DC | PRN
  Administered 2016-05-27 (×2): 80 mg via ORAL
  Filled 2016-05-26 (×2): qty 1

## 2016-05-26 MED ORDER — CYCLOBENZAPRINE HCL 10 MG PO TABS
10.0000 mg | ORAL_TABLET | Freq: Once | ORAL | Status: AC
  Administered 2016-05-26: 10 mg via ORAL
  Filled 2016-05-26: qty 1

## 2016-05-26 MED ORDER — FLEET ENEMA 7-19 GM/118ML RE ENEM: 1.0000 | ENEMA | RECTAL | Status: DC | PRN

## 2016-05-26 MED ORDER — ACETAMINOPHEN 325 MG PO TABS
650.0000 mg | ORAL_TABLET | ORAL | Status: DC | PRN
  Administered 2016-05-26 – 2016-05-28 (×7): 650 mg via ORAL
  Filled 2016-05-26 (×7): qty 2

## 2016-05-26 MED ORDER — LACTATED RINGERS IV SOLN: 500.0000 mL | INTRAVENOUS | Status: DC | PRN

## 2016-05-26 MED ORDER — TETANUS-DIPHTH-ACELL PERTUSSIS 5-2.5-18.5 LF-MCG/0.5 IM SUSP: 0.5000 mL | Freq: Once | INTRAMUSCULAR | Status: DC

## 2016-05-26 MED ORDER — DOCUSATE SODIUM 100 MG PO CAPS
100.0000 mg | ORAL_CAPSULE | Freq: Two times a day (BID) | ORAL | Status: DC
  Administered 2016-05-26 – 2016-05-28 (×3): 100 mg via ORAL
  Filled 2016-05-26 (×3): qty 1

## 2016-05-26 NOTE — H&P (Signed)
Makayla Colon is a 35 y.o. female [id number] with IUP at 2135w5d presenting for contractions. Pt states she has been having regular, every 2-3 minutes contractions, associated with none vaginal bleeding for 4 hours..  Membranes are intact, with active fetal movement.   PNCare at [facility] since 26 wks  Prenatal History/Complications:   Late PNC, Hx 32 week PTD  Past Medical History: Past Medical History  Diagnosis Date  . Pregnant 02/10/2016  . Smoker 02/10/2016  . AMA (advanced maternal age) multigravida 35+ 02/10/2016    Past Surgical History: History reviewed. No pertinent past surgical history.  Obstetrical History: OB History    Gravida Para Term Preterm AB TAB SAB Ectopic Multiple Living   7 3 2 1 3 1 2  0 0 3       Social History: Social History   Social History  . Marital Status: Single    Spouse Name: N/A  . Number of Children: N/A  . Years of Education: N/A   Social History Main Topics  . Smoking status: Current Every Day Smoker -- 20 years    Types: Cigarettes  . Smokeless tobacco: Never Used     Comment: smokes 2-3 cig daily  . Alcohol Use: No     Comment: not now  . Drug Use: No  . Sexual Activity: Not Currently    Birth Control/ Protection: None   Other Topics Concern  . None   Social History Narrative    Family History: Family History  Problem Relation Age of Onset  . Hypertension Mother   . Cancer Mother   . Asthma Son   . Cancer Maternal Grandmother   . Other Maternal Grandmother     blood clots  . Hypertension Maternal Grandmother   . Cancer Maternal Grandfather     Allergies: Allergies  Allergen Reactions  . Other Anaphylaxis and Itching    Onions and tomatoes-swelling in throat and itching    Prescriptions prior to admission  Medication Sig Dispense Refill Last Dose  . prenatal vitamin w/FE, FA (PRENATAL 1 + 1) 27-1 MG TABS tablet Take 1 tablet by mouth daily at 12 noon. 30 each 11 05/25/2016 at Unknown time     Prenatal Transfer Tool   Maternal Diabetes: No Genetic Screening: too late Maternal Ultrasounds/Referrals: Normal Fetal Ultrasounds or other Referrals:  None Maternal Substance Abuse:  No Significant Maternal Medications:  None Significant Maternal Lab Results: None     Review of Systems   Constitutional: Negative for fever and chills Eyes: Negative for visual disturbances Respiratory: Negative for shortness of breath, dyspnea Cardiovascular: Negative for chest pain or palpitations  Gastrointestinal: Negative for vomiting, diarrhea and constipation.  POSITIVE for abdominal pain (contractions) Genitourinary: Negative for dysuria and urgency Musculoskeletal: Negative for back pain, joint pain, myalgias  Neurological: Negative for dizziness and headaches      Blood pressure 116/68, pulse 82, temperature 98.3 F (36.8 C), temperature source Oral, resp. rate 18, height 5\' 4"  (1.626 m), weight 64.864 kg (143 lb), SpO2 97 %. General appearance: alert, cooperative and mild distress Lungs: clear to auscultation bilaterally Heart: regular rate and rhythm Abdomen: soft, non-tender; bowel sounds normal Pelvic: 9/100/0 Extremities: Homans sign is negative, no sign of DVT DTR's 2+ Presentation: cephalic Fetal monitoring  Baseline: 140 bpm, Variability: Good {> 6 bpm), Accelerations: Reactive and Decelerations: Absent Uterine activity  2-3 minutes   Prenatal labs: ABO, Rh: O/Positive/-- (03/29 1212) Antibody: Negative (04/12 0855) Rubella: !Error! RPR: Non Reactive (04/12 0855)  HBsAg: Negative (03/29  1212)  HIV: Non Reactive (04/12 0855)  GBS: Negative (06/09 0000)    Results for orders placed or performed during the hospital encounter of 05/26/16 (from the past 24 hour(s))  CBC   Collection Time: 05/26/16  7:50 AM  Result Value Ref Range   WBC 12.8 (H) 4.0 - 10.5 K/uL   RBC 4.16 3.87 - 5.11 MIL/uL   Hemoglobin 13.5 12.0 - 15.0 g/dL   HCT 16.138.8 09.636.0 - 04.546.0 %   MCV 93.3 78.0 - 100.0 fL   MCH 32.5  26.0 - 34.0 pg   MCHC 34.8 30.0 - 36.0 g/dL   RDW 40.913.7 81.111.5 - 91.415.5 %   Platelets 183 150 - 400 K/uL  Results for orders placed or performed in visit on 05/25/16 (from the past 24 hour(s))  POCT urinalysis dipstick   Collection Time: 05/25/16 10:54 AM  Result Value Ref Range   Color, UA yellow    Clarity, UA clear    Glucose, UA neg    Bilirubin, UA     Ketones, UA neg    Spec Grav, UA     Blood, UA neg    pH, UA     Protein, UA trace    Urobilinogen, UA     Nitrite, UA neg    Leukocytes, UA Negative Negative    Assessment: Makayla Colon is a 35 y.o. [id number] with an IUP at 5135w5d presenting for active labor  Plan: #Labor: expectant management #Pain:  Per request #FWB Cat 1   CRESENZO-DISHMAN,Kahmari Koller 05/26/2016, 9:00 AM

## 2016-05-26 NOTE — MAU Note (Signed)
Contractions every 5-7 mins. Denies LOF or vag bleeding. +FM. SVE 3cm yesterday.

## 2016-05-26 NOTE — Anesthesia Preprocedure Evaluation (Addendum)
Anesthesia Evaluation  Patient identified by MRN, date of birth, ID band Patient awake    Reviewed: Allergy & Precautions, NPO status , Patient's Chart, lab work & pertinent test results  Airway Mallampati: II  TM Distance: >3 FB Neck ROM: Full    Dental   Pulmonary Current Smoker,    breath sounds clear to auscultation       Cardiovascular negative cardio ROS   Rhythm:Regular Rate:Normal     Neuro/Psych negative neurological ROS     GI/Hepatic negative GI ROS, Neg liver ROS,   Endo/Other  negative endocrine ROS  Renal/GU negative Renal ROS     Musculoskeletal   Abdominal   Peds  Hematology negative hematology ROS (+)   Anesthesia Other Findings   Reproductive/Obstetrics                            Lab Results  Component Value Date   WBC 12.8* 05/26/2016   HGB 13.5 05/26/2016   HCT 38.8 05/26/2016   MCV 93.3 05/26/2016   PLT 183 05/26/2016   No results found for: CREATININE, BUN, NA, K, CL, CO2  Anesthesia Physical Anesthesia Plan  ASA: II  Anesthesia Plan: Spinal   Post-op Pain Management:    Induction:   Airway Management Planned: Natural Airway  Additional Equipment:   Intra-op Plan:   Post-operative Plan:   Informed Consent: I have reviewed the patients History and Physical, chart, labs and discussed the procedure including the risks, benefits and alternatives for the proposed anesthesia with the patient or authorized representative who has indicated his/her understanding and acceptance.     Plan Discussed with: Surgeon and CRNA  Anesthesia Plan Comments:        Anesthesia Quick Evaluation

## 2016-05-27 ENCOUNTER — Inpatient Hospital Stay (HOSPITAL_COMMUNITY): Payer: Medicaid Other | Admitting: Anesthesiology

## 2016-05-27 ENCOUNTER — Encounter (HOSPITAL_COMMUNITY): Payer: Self-pay | Admitting: Anesthesiology

## 2016-05-27 ENCOUNTER — Encounter (HOSPITAL_COMMUNITY)
Admission: AD | Disposition: A | Discharge status: Home / Self Care | Payer: Self-pay | Source: Ambulatory Visit | Attending: Family Medicine

## 2016-05-27 DIAGNOSIS — Z302 Encounter for sterilization: Secondary | ICD-10-CM

## 2016-05-27 HISTORY — PX: TUBAL LIGATION: SHX77

## 2016-05-27 SURGERY — LIGATION, FALLOPIAN TUBE, POSTPARTUM
Anesthesia: Spinal

## 2016-05-27 MED ORDER — PROPOFOL 10 MG/ML IV BOLUS
INTRAVENOUS | Status: DC | PRN
  Administered 2016-05-27: 10 mg via INTRAVENOUS

## 2016-05-27 MED ORDER — PROPOFOL 10 MG/ML IV BOLUS
INTRAVENOUS | Status: AC
  Filled 2016-05-27: qty 20

## 2016-05-27 MED ORDER — FENTANYL CITRATE (PF) 100 MCG/2ML IJ SOLN
INTRAMUSCULAR | Status: AC
  Filled 2016-05-27: qty 2

## 2016-05-27 MED ORDER — BUPIVACAINE IN DEXTROSE 0.75-8.25 % IT SOLN
INTRATHECAL | Status: DC | PRN
  Administered 2016-05-27: 1.4 mL via INTRATHECAL

## 2016-05-27 MED ORDER — KETOROLAC TROMETHAMINE 30 MG/ML IJ SOLN
INTRAMUSCULAR | Status: AC
  Filled 2016-05-27: qty 1

## 2016-05-27 MED ORDER — ONDANSETRON HCL 4 MG/2ML IJ SOLN
INTRAMUSCULAR | Status: DC | PRN
  Administered 2016-05-27: 4 mg via INTRAVENOUS

## 2016-05-27 MED ORDER — FENTANYL CITRATE (PF) 100 MCG/2ML IJ SOLN
25.0000 ug | INTRAMUSCULAR | Status: DC | PRN
  Administered 2016-05-27: 25 ug via INTRAVENOUS

## 2016-05-27 MED ORDER — ONDANSETRON HCL 4 MG/2ML IJ SOLN
INTRAMUSCULAR | Status: AC
  Filled 2016-05-27: qty 2

## 2016-05-27 MED ORDER — KETOROLAC TROMETHAMINE 30 MG/ML IJ SOLN
30.0000 mg | Freq: Once | INTRAMUSCULAR | Status: AC | PRN
  Administered 2016-05-27: 30 mg via INTRAVENOUS

## 2016-05-27 MED ORDER — PHENYLEPHRINE 40 MCG/ML (10ML) SYRINGE FOR IV PUSH (FOR BLOOD PRESSURE SUPPORT)
PREFILLED_SYRINGE | INTRAVENOUS | Status: AC
  Filled 2016-05-27: qty 10

## 2016-05-27 MED ORDER — EPHEDRINE 5 MG/ML INJ
INTRAVENOUS | Status: AC
  Filled 2016-05-27: qty 10

## 2016-05-27 MED ORDER — PROMETHAZINE HCL 25 MG/ML IJ SOLN: 6.2500 mg | INTRAMUSCULAR | Status: DC | PRN

## 2016-05-27 MED ORDER — FAMOTIDINE 20 MG PO TABS
40.0000 mg | ORAL_TABLET | Freq: Once | ORAL | Status: AC
  Administered 2016-05-27: 40 mg via ORAL
  Filled 2016-05-27: qty 2

## 2016-05-27 MED ORDER — 0.9 % SODIUM CHLORIDE (POUR BTL) OPTIME
TOPICAL | Status: DC | PRN
  Administered 2016-05-27: 1000 mL

## 2016-05-27 MED ORDER — FENTANYL CITRATE (PF) 100 MCG/2ML IJ SOLN
INTRAMUSCULAR | Status: DC | PRN
  Administered 2016-05-27 (×3): 50 ug via INTRAVENOUS

## 2016-05-27 MED ORDER — FENTANYL CITRATE (PF) 100 MCG/2ML IJ SOLN
INTRAMUSCULAR | Status: AC
  Administered 2016-05-27: 25 ug via INTRAVENOUS
  Filled 2016-05-27: qty 2

## 2016-05-27 MED ORDER — MIDAZOLAM HCL 2 MG/2ML IJ SOLN
INTRAMUSCULAR | Status: AC
  Filled 2016-05-27: qty 2

## 2016-05-27 MED ORDER — METOCLOPRAMIDE HCL 10 MG PO TABS
10.0000 mg | ORAL_TABLET | Freq: Once | ORAL | Status: AC
  Administered 2016-05-27: 10 mg via ORAL
  Filled 2016-05-27: qty 1

## 2016-05-27 MED ORDER — OXYCODONE-ACETAMINOPHEN 5-325 MG PO TABS: 1.0000 | ORAL_TABLET | Freq: Four times a day (QID) | ORAL | Status: DC | PRN

## 2016-05-27 MED ORDER — MIDAZOLAM HCL 2 MG/2ML IJ SOLN
INTRAMUSCULAR | Status: DC | PRN
  Administered 2016-05-27 (×2): 1 mg via INTRAVENOUS

## 2016-05-27 MED ORDER — BUPIVACAINE HCL (PF) 0.25 % IJ SOLN
INTRAMUSCULAR | Status: DC | PRN
  Administered 2016-05-27: 10 mL

## 2016-05-27 MED ORDER — LACTATED RINGERS IV SOLN
INTRAVENOUS | Status: DC | PRN
  Administered 2016-05-27 (×2): via INTRAVENOUS

## 2016-05-27 SURGICAL SUPPLY — 23 items
BLADE SURG 11 STRL SS (BLADE) ×3 IMPLANT
CLIP FILSHIE TUBAL LIGA STRL (Clip) ×3 IMPLANT
CLOTH BEACON ORANGE TIMEOUT ST (SAFETY) ×3 IMPLANT
DRSG OPSITE POSTOP 3X4 (GAUZE/BANDAGES/DRESSINGS) ×3 IMPLANT
DURAPREP 26ML APPLICATOR (WOUND CARE) ×3 IMPLANT
GLOVE BIOGEL PI IND STRL 7.0 (GLOVE) ×1 IMPLANT
GLOVE BIOGEL PI IND STRL 7.5 (GLOVE) ×1 IMPLANT
GLOVE BIOGEL PI INDICATOR 7.0 (GLOVE) ×2
GLOVE BIOGEL PI INDICATOR 7.5 (GLOVE) ×2
GLOVE ECLIPSE 7.5 STRL STRAW (GLOVE) ×3 IMPLANT
GOWN STRL REUS W/TWL LRG LVL3 (GOWN DISPOSABLE) ×6 IMPLANT
LIQUID BAND (GAUZE/BANDAGES/DRESSINGS) ×3 IMPLANT
NEEDLE HYPO 22GX1.5 SAFETY (NEEDLE) ×3 IMPLANT
NS IRRIG 1000ML POUR BTL (IV SOLUTION) ×3 IMPLANT
PACK ABDOMINAL MINOR (CUSTOM PROCEDURE TRAY) ×3 IMPLANT
PROTECTOR NERVE ULNAR (MISCELLANEOUS) ×6 IMPLANT
SPONGE LAP 4X18 X RAY DECT (DISPOSABLE) ×3 IMPLANT
SUT VICRYL 0 UR6 27IN ABS (SUTURE) ×3 IMPLANT
SUT VICRYL 4-0 PS2 18IN ABS (SUTURE) ×3 IMPLANT
SYR CONTROL 10ML LL (SYRINGE) ×3 IMPLANT
TOWEL OR 17X24 6PK STRL BLUE (TOWEL DISPOSABLE) ×6 IMPLANT
TRAY FOLEY CATH SILVER 14FR (SET/KITS/TRAYS/PACK) ×3 IMPLANT
WATER STERILE IRR 1000ML POUR (IV SOLUTION) ×3 IMPLANT

## 2016-05-27 NOTE — Anesthesia Procedure Notes (Signed)
Spinal  Staffing Anesthesiologist: Marcene DuosFITZGERALD, Aarika Moon Performed by: anesthesiologist  Preanesthetic Checklist Completed: patient identified, site marked, surgical consent, pre-op evaluation, timeout performed, IV checked, risks and benefits discussed and monitors and equipment checked Spinal Block Patient position: sitting Prep: site prepped and draped and DuraPrep Patient monitoring: blood pressure, continuous pulse ox and heart rate Approach: midline Location: L4-5 Injection technique: single-shot Needle Needle type: Pencan  Needle gauge: 24 G Needle length: 9 cm Assessment Sensory level: T6

## 2016-05-27 NOTE — Op Note (Signed)
Makayla KaysFelicia Colon 05/26/2016 - 05/27/2016  PREOPERATIVE DIAGNOSIS:  Undesired fertility  POSTOPERATIVE DIAGNOSIS:  Undesired fertility  PROCEDURE:  Postpartum Bilateral Tubal Sterilization using Filshie Clips   SURGEON:  Dr Candelaria CelesteJacob Stinson  ANESTHESIA:  Epidural  COMPLICATIONS:  None immediate.  ESTIMATED BLOOD LOSS:  Less than 20cc.  FLUIDS: 1500 cc LR.  URINE OUTPUT:  1 cc of clear urine.  INDICATIONS: 35 y.o. yo [id number]  with undesired fertility,status post vaginal delivery, desires permanent sterilization. Risks and benefits of procedure discussed with patient including permanence of method, bleeding, infection, injury to surrounding organs and need for additional procedures. Risk failure of 0.5-1% with increased risk of ectopic gestation if pregnancy occurs was also discussed with patient.   FINDINGS:  Normal uterus, tubes, and ovaries.  TECHNIQUE:  The patient was taken to the operating room where her epidural anesthesia was dosed up to surgical level and found to be adequate.  She was then placed in the dorsal supine position and prepped and draped in sterile fashion.  After an adequate timeout was performed, attention was turned to the patient's abdomen where a small transverse skin incision was made under the umbilical fold. The incision was taken down to the layer of fascia using the scalpel, and fascia was incised, and extended bilaterally using Mayo scissors. The peritoneum was entered in a sharp fashion. Attention was then turned to the patient's uterus, and left fallopian tube was identified and followed out to the fimbriated end.  A Filshie clip was placed on the left fallopian tube about 2 cm from the cornual attachment, with care given to incorporate the underlying mesosalpinx.  A similar process was carried out on the rightl side allowing for bilateral tubal sterilization.  Good hemostasis was noted overall.  Local analgesia was drizzled on both operative sites.The instruments were  then removed from the patient's abdomen and the fascial incision was repaired with 0 Vicryl, and the skin was closed with a 3-0 Monocryl subcuticular stitch. The patient tolerated the procedure well.  Sponge, lap, and needle counts were correct times two.  The patient was then taken to the recovery room awake, extubated and in stable condition.   Levie HeritageJacob J Stinson, DO 05/27/2016 10:06 AM

## 2016-05-27 NOTE — Anesthesia Postprocedure Evaluation (Signed)
Anesthesia Post Note  Patient: Makayla Colon  Procedure(s) Performed: Procedure(s) (LRB): POST PARTUM TUBAL LIGATION (N/A)  Patient location during evaluation: PACU Anesthesia Type: Spinal Level of consciousness: awake and alert Pain management: pain level controlled Vital Signs Assessment: post-procedure vital signs reviewed and stable Respiratory status: spontaneous breathing and respiratory function stable Cardiovascular status: blood pressure returned to baseline and stable Postop Assessment: spinal receding Anesthetic complications: no     Last Vitals:  Filed Vitals:   05/27/16 1333 05/27/16 1446  BP: 103/74 101/61  Pulse: 60 73  Temp: 36.6 C 36.7 C  Resp: 16 15    Last Pain:  Filed Vitals:   05/27/16 1449  PainSc: 4    Pain Goal: Patients Stated Pain Goal: 4 (05/27/16 1446)               Kennieth RadFitzgerald, Adrianne Shackleton E

## 2016-05-27 NOTE — Progress Notes (Signed)
Post Partum Day 1 Subjective: no complaints, up ad lib, voiding and tolerating PO  Objective: Blood pressure 104/73, pulse 67, temperature 98.2 F (36.8 C), temperature source Oral, resp. rate 18, height 5\' 4"  (1.626 m), weight 143 lb (64.864 kg), SpO2 100 %, unknown if currently breastfeeding.  Physical Exam:  General: alert, cooperative and no distress Lochia: appropriate Uterine Fundus: firm DVT Evaluation: No evidence of DVT seen on physical exam. Negative Homan's sign. No cords or calf tenderness.   Recent Labs  05/26/16 0750  HGB 13.5  HCT 38.8    Assessment/Plan: Plan for discharge tomorrow and Breastfeeding  Risks of procedure discussed with patient including but not limited to: risk of regret, permanence of method, bleeding, infection, injury to surrounding organs and need for additional procedures.  Failure risk of 1 -2 %.  Levie HeritageJacob J Stinson, DO 05/27/2016 9:11 AM     LOS: 1 day   STINSON, JACOB JEHIEL 05/27/2016, 9:11 AM

## 2016-05-27 NOTE — Transfer of Care (Signed)
Immediate Anesthesia Transfer of Care Note  Patient: Makayla KaysFelicia Colon  Procedure(s) Performed: Procedure(s): POST PARTUM TUBAL LIGATION (N/A)  Patient Location: PACU  Anesthesia Type:Spinal  Level of Consciousness: awake, alert  and oriented  Airway & Oxygen Therapy: Patient Spontanous Breathing  Post-op Assessment: Report given to RN and Post -op Vital signs reviewed and stable  Post vital signs: Reviewed and stable  Last Vitals:  Filed Vitals:   05/27/16 0532 05/27/16 0752  BP: 100/70 104/73  Pulse: 69 67  Temp: 36.7 C 36.8 C  Resp: 18     Last Pain:  Filed Vitals:   05/27/16 0756  PainSc: 5       Patients Stated Pain Goal: 4 (05/26/16 1627)  Complications: No apparent anesthesia complications

## 2016-05-28 MED ORDER — OXYCODONE HCL 10 MG PO TABS: 10.0000 mg | ORAL_TABLET | ORAL | Status: DC | PRN

## 2016-05-28 MED ORDER — DOCUSATE SODIUM 100 MG PO CAPS: 100.0000 mg | ORAL_CAPSULE | Freq: Two times a day (BID) | ORAL | Status: DC

## 2016-05-28 MED ORDER — IBUPROFEN 600 MG PO TABS: 600.0000 mg | ORAL_TABLET | Freq: Four times a day (QID) | ORAL | Status: DC

## 2016-05-28 NOTE — Discharge Summary (Signed)
OB Discharge Summary  Patient Name: Makayla Colon DOB: 24-Apr-1981 MRN: [id number]  Date of admission: 05/26/2016 Delivering MD: Jacklyn ShellRESENZO-DISHMON, FRANCES   Date of discharge: 05/28/2016  Admitting diagnosis: 39 WEEKS CTX Desires Sterilization Intrauterine pregnancy: 7640w5d     Secondary diagnosis:Active Problems:   Active labor  Additional problems:none     Discharge diagnosis: Term Pregnancy Delivered                                                                     Post partum procedures:postpartum tubal ligation  Augmentation: none  Complications: None  Hospital course:  Onset of Labor With Vaginal Delivery     35 y.o. yo [id number] at 3240w5d was admitted in Active Labor on 05/26/2016. Patient had an uncomplicated labor course as follows:  Membrane Rupture Time/Date: 7:52 AM ,05/26/2016   Intrapartum Procedures: Episiotomy:                                           Lacerations:  1st degree [2];Vaginal [6]  Patient had a delivery of a Viable infant. 05/26/2016  Information for the patient's newborn:  Waverly FerrariCarter, Boy Ruben [[id number]][[id number]]  Delivery Method: Vaginal, Spontaneous Delivery (Filed from Delivery Summary)    Pateint had an uncomplicated postpartum course.  She is ambulating, tolerating a regular diet, passing flatus, and urinating well. Patient is discharged home in stable condition on 05/28/2016.    Physical exam  Filed Vitals:   05/27/16 1930 05/27/16 2326 05/28/16 0330 05/28/16 0530  BP: 107/68 99/59 99/56  106/63  Pulse: 77 67 63 65  Temp:  98.9 F (37.2 C) 98 F (36.7 C) 98.3 F (36.8 C)  TempSrc:      Resp: 18 18 18 18   Height:      Weight:      SpO2: 98% 99% 98% 100%   General: alert, cooperative and no distress Lochia: appropriate Uterine Fundus: firm Incision: Healing well with no significant drainage, No significant erythema, Dressing is clean, dry, and intact DVT Evaluation: No evidence of DVT seen on physical exam. Negative Homan's  sign. No cords or calf tenderness. Labs: Lab Results  Component Value Date   WBC 12.8* 05/26/2016   HGB 13.5 05/26/2016   HCT 38.8 05/26/2016   MCV 93.3 05/26/2016   PLT 183 05/26/2016   No flowsheet data found.  Discharge instruction: per After Visit Summary and "Baby and Me Booklet".  After Visit Meds:    Medication List    ASK your doctor about these medications        prenatal vitamin w/FE, FA 27-1 MG Tabs tablet  Take 1 tablet by mouth daily at 12 noon.        Diet: routine diet  Activity: Advance as tolerated. Pelvic rest for 6 weeks.   Outpatient follow up:6 weeks Follow up Appt:Future Appointments Date Time Provider Department Center  06/01/2016 10:30 AM Cheral MarkerKimberly R Booker, CNM [facility]   Follow up visit: No Follow-up on file.  Postpartum contraception: Tubal Ligation  Newborn Data: Live born female  Birth Weight: 6 lb 6.8 oz (2915 g) APGAR: 8, 9  Baby Feeding: Bottle  and Breast Disposition:home with mother   05/28/2016 Wyvonnia DuskyMarie Lawson, CNM

## 2016-05-28 NOTE — Anesthesia Postprocedure Evaluation (Signed)
Anesthesia Post Note  Patient: Marja KaysFelicia Gubler  Procedure(s) Performed: Procedure(s) (LRB): POST PARTUM TUBAL LIGATION (N/A)  Patient location during evaluation: Mother Baby Anesthesia Type: Spinal Level of consciousness: awake and alert and oriented Pain management: satisfactory to patient Vital Signs Assessment: post-procedure vital signs reviewed and stable Respiratory status: respiratory function stable and spontaneous breathing Cardiovascular status: blood pressure returned to baseline Postop Assessment: no headache, no backache, spinal receding, patient able to bend at knees and adequate PO intake Anesthetic complications: no     Last Vitals:  Filed Vitals:   05/28/16 0330 05/28/16 0530  BP: 99/56 106/63  Pulse: 63 65  Temp: 36.7 C 36.8 C  Resp: 18 18    Last Pain:  Filed Vitals:   05/28/16 0838  PainSc: 0-No pain   Pain Goal: Patients Stated Pain Goal: 4 (05/27/16 1805)               Karleen DolphinFUSSELL,Leticia Coletta

## 2016-05-28 NOTE — Addendum Note (Signed)
Addendum  created 05/28/16 0840 by Graciela HusbandsWynn O Demba Nigh, CRNA   Modules edited: Notes Section   Notes Section:  File: [id number]

## 2016-06-01 ENCOUNTER — Encounter: Payer: Self-pay | Admitting: Obstetrics & Gynecology

## 2016-06-01 ENCOUNTER — Ambulatory Visit (INDEPENDENT_AMBULATORY_CARE_PROVIDER_SITE_OTHER): Payer: Medicaid Other | Admitting: Obstetrics & Gynecology

## 2016-06-01 ENCOUNTER — Encounter: Payer: Medicaid Other | Admitting: Women's Health

## 2016-06-01 VITALS — BP 120/80 | HR 68 | Wt 135.0 lb

## 2016-06-01 DIAGNOSIS — Z9889 Other specified postprocedural states: Secondary | ICD-10-CM | POA: Diagnosis not present

## 2016-06-01 NOTE — Progress Notes (Signed)
Patient ID: Makayla Colon, female   DOB: 18-Mar-1981, 35 y.o.   MRN: [id number]  HPI: Patient returns for routine postoperative follow-up having undergone post partum BTL with Filshie clips on 7/1.  The patient's immediate postoperative recovery has been unremarkable. Since hospital discharge the patient reports no problems.   Current Outpatient Prescriptions: docusate sodium (COLACE) 100 MG capsule, Take 1 capsule (100 mg total) by mouth 2 (two) times daily., Disp: 10 capsule, Rfl: 0 ibuprofen (ADVIL,MOTRIN) 600 MG tablet, Take 1 tablet (600 mg total) by mouth every 6 (six) hours., Disp: 30 tablet, Rfl: 0 oxyCODONE 10 MG TABS, Take 1 tablet (10 mg total) by mouth every 4 (four) hours as needed (pain scale > 7)., Disp: 30 tablet, Rfl: 0 prenatal vitamin w/FE, FA (PRENATAL 1 + 1) 27-1 MG TABS tablet, Take 1 tablet by mouth daily at 12 noon., Disp: 30 each, Rfl: 11  No current facility-administered medications for this visit.    Blood pressure 120/80, pulse 68, weight 135 lb (61.236 kg), unknown if currently breastfeeding.  Physical Exam: Incision clean dry intact  Diagnostic Tests:   Pathology:   Impression: S/p pp btl  Plan: Pp exam  Follow up: 5  weeks  Lazaro ArmsEURE,LUTHER H, MD

## 2016-06-02 ENCOUNTER — Encounter (HOSPITAL_COMMUNITY): Payer: Self-pay | Admitting: Family Medicine

## 2016-07-06 ENCOUNTER — Ambulatory Visit (INDEPENDENT_AMBULATORY_CARE_PROVIDER_SITE_OTHER): Payer: Medicaid Other | Admitting: Obstetrics & Gynecology

## 2016-07-06 ENCOUNTER — Encounter: Payer: Self-pay | Admitting: Obstetrics & Gynecology

## 2016-07-06 NOTE — Progress Notes (Signed)
Subjective:     Makayla Colon is a 35 y.o. female who presents for a postpartum visit. She is 6 weeks postpartum following a spontaneous vaginal delivery. I have fully reviewed the prenatal and intrapartum course. The delivery was at 39+ gestational weeks. Outcome: spontaneous vaginal delivery. Anesthesia: none. Postpartum course has been unremarkable. Baby's course has been normal. Baby is feeding by bottle. Bleeding pink. Bowel function is normal. Bladder function is normal. Patient is sexually active. Contraception method is tubal ligation. Postpartum depression screening: negative.  The following portions of the patient's history were reviewed and updated as appropriate: allergies, current medications, past family history, past medical history, past social history, past surgical history and problem list.  Review of Systems Pertinent items are noted in HPI.   Objective:    BP 120/80   Pulse 78   Wt 127 lb (57.6 kg)   BMI 21.80 kg/m   General:  alert, cooperative and no distress   Breasts:    Lungs:   Heart:    Abdomen: soft, non-tender; bowel sounds normal; no masses,  no organomegaly   Vulva:  normal  Vagina: normal vagina, no discharge, exudate, lesion, or erythema  Cervix:  no cervical motion tenderness and no lesions  Corpus: normal size, contour, position, consistency, mobility, non-tender  Adnexa:  normal adnexa  Rectal Exam:         Assessment:     normal postpartum exam. Pap smear not done at today's visit.   Plan:    1. Contraception: tubal ligation 2.  3. Follow up in: 6 months or as needed.

## 2016-08-22 ENCOUNTER — Encounter: Payer: Self-pay | Admitting: *Deleted

## 2017-01-11 ENCOUNTER — Other Ambulatory Visit: Payer: Medicaid Other | Admitting: Obstetrics & Gynecology

## 2018-09-13 ENCOUNTER — Emergency Department (HOSPITAL_COMMUNITY)
Admission: EM | Admit: 2018-09-13 | Discharge: 2018-09-13 | Disposition: A | Discharge status: Home / Self Care | Payer: Medicaid Other | Attending: Emergency Medicine | Admitting: Emergency Medicine

## 2018-09-13 ENCOUNTER — Encounter (HOSPITAL_COMMUNITY): Payer: Self-pay | Admitting: Emergency Medicine

## 2018-09-13 ENCOUNTER — Other Ambulatory Visit: Payer: Self-pay

## 2018-09-13 ENCOUNTER — Emergency Department (HOSPITAL_COMMUNITY): Payer: Medicaid Other

## 2018-09-13 DIAGNOSIS — Z79899 Other long term (current) drug therapy: Secondary | ICD-10-CM | POA: Insufficient documentation

## 2018-09-13 DIAGNOSIS — F419 Anxiety disorder, unspecified: Secondary | ICD-10-CM

## 2018-09-13 DIAGNOSIS — E876 Hypokalemia: Secondary | ICD-10-CM

## 2018-09-13 DIAGNOSIS — R200 Anesthesia of skin: Secondary | ICD-10-CM

## 2018-09-13 DIAGNOSIS — R05 Cough: Secondary | ICD-10-CM | POA: Diagnosis not present

## 2018-09-13 DIAGNOSIS — F17210 Nicotine dependence, cigarettes, uncomplicated: Secondary | ICD-10-CM | POA: Diagnosis not present

## 2018-09-13 HISTORY — DX: Anemia, unspecified: D64.9

## 2018-09-13 LAB — CBC
HEMATOCRIT: 46.2 % — AB (ref 36.0–46.0)
HEMOGLOBIN: 14.9 g/dL (ref 12.0–15.0)
MCH: 32.2 pg (ref 26.0–34.0)
MCHC: 32.3 g/dL (ref 30.0–36.0)
MCV: 99.8 fL (ref 80.0–100.0)
Platelets: 216 10*3/uL (ref 150–400)
RBC: 4.63 MIL/uL (ref 3.87–5.11)
RDW: 12.2 % (ref 11.5–15.5)
WBC: 6.8 10*3/uL (ref 4.0–10.5)
nRBC: 0 % (ref 0.0–0.2)

## 2018-09-13 LAB — COMPREHENSIVE METABOLIC PANEL
ALK PHOS: 59 U/L (ref 38–126)
ALT: 112 U/L — AB (ref 0–44)
AST: 132 U/L — ABNORMAL HIGH (ref 15–41)
Albumin: 4.3 g/dL (ref 3.5–5.0)
Anion gap: 9 (ref 5–15)
BILIRUBIN TOTAL: 0.8 mg/dL (ref 0.3–1.2)
BUN: <5 mg/dL — ABNORMAL LOW (ref 6–20)
CALCIUM: 8.9 mg/dL (ref 8.9–10.3)
CO2: 26 mmol/L (ref 22–32)
CREATININE: 0.59 mg/dL (ref 0.44–1.00)
Chloride: 98 mmol/L (ref 98–111)
GFR calc non Af Amer: >60 mL/min (ref >60)
GLUCOSE: 132 mg/dL — AB (ref 70–99)
Potassium: 2.6 mmol/L — CL (ref 3.5–5.1)
SODIUM: 133 mmol/L — AB (ref 135–145)
TOTAL PROTEIN: 7.5 g/dL (ref 6.5–8.1)

## 2018-09-13 LAB — I-STAT BETA HCG BLOOD, ED (MC, WL, AP ONLY)

## 2018-09-13 LAB — CBG MONITORING, ED: Glucose-Capillary: 150 mg/dL — ABNORMAL HIGH (ref 70–99)

## 2018-09-13 MED ORDER — LORAZEPAM 2 MG/ML IJ SOLN
1.0000 mg | Freq: Once | INTRAMUSCULAR | Status: AC
  Administered 2018-09-13: 1 mg via INTRAVENOUS
  Filled 2018-09-13: qty 1

## 2018-09-13 MED ORDER — POTASSIUM CHLORIDE CRYS ER 20 MEQ PO TBCR: 20.0000 meq | EXTENDED_RELEASE_TABLET | Freq: Two times a day (BID) | ORAL | 0 refills | Status: AC

## 2018-09-13 MED ORDER — SODIUM CHLORIDE 0.9 % IV SOLN
INTRAVENOUS | Status: DC
  Administered 2018-09-13: 19:00:00 via INTRAVENOUS

## 2018-09-13 MED ORDER — POTASSIUM CHLORIDE CRYS ER 20 MEQ PO TBCR
40.0000 meq | EXTENDED_RELEASE_TABLET | Freq: Once | ORAL | Status: AC
  Administered 2018-09-13: 40 meq via ORAL
  Filled 2018-09-13: qty 2

## 2018-09-13 MED ORDER — LORAZEPAM 1 MG PO TABS: 1.0000 mg | ORAL_TABLET | Freq: Two times a day (BID) | ORAL | 0 refills | Status: AC

## 2018-09-13 NOTE — Discharge Instructions (Signed)
Work-up.  And I did identify low potassium.  Not certain that that played a role with your numbness although it can because the numbness seemed to be improved by the Ativan.  Would suggest there may have been a component of some anxiety.  Take the Ativan as directed.  Take the oral potassium as directed.  And increase foods that are high in potassium particularly bananas may be a good choice.  Follow-up with the health department or return here to our fast track for repeat potassium in 1 to 2 weeks.  Return for any new or worse symptoms.

## 2018-09-13 NOTE — ED Provider Notes (Signed)
[facility] EMERGENCY DEPARTMENT Provider Note   CSN: [id number] Arrival date & time: 09/13/18  1746     History   Chief Complaint Chief Complaint  Patient presents with  . Numbness    HPI Makayla Colon is a 37 y.o. female.  Patient brought in by a friend by private vehicle.  Patient with complaint of numbness at both knees.  That started at about 330 today.  Started getting worse with numbness to the left elbow and right side of the neck.  Patient appeared anxious.  Patient felt anxious.  Patient states she vomited once.  Patient denies any trouble with speech any headache or any fevers.  She also had some of the elbow numbness yesterday.  Prior to that she is never had anything like this.  Also not associated with any weakness.  Patient does states she has had a little bit of an upper respiratory infection with some mild cough and some mild congestion but it has not been severe.     Past Medical History:  Diagnosis Date  . AMA (advanced maternal age) multigravida 35+ 02/10/2016  . Anemia   . Pregnant 02/10/2016  . Smoker 02/10/2016    Patient Active Problem List   Diagnosis Date Noted  . Active labor 05/26/2016  . History of preterm delivery, currently pregnant 03/08/2016  . Late prenatal care affecting pregnancy 03/08/2016  . History of prior pregnancy with IUGR newborn 03/08/2016  . Supervision of normal pregnancy 02/23/2016  . Smoker 02/10/2016  . AMA (advanced maternal age) multigravida 35+ 02/10/2016    Past Surgical History:  Procedure Laterality Date  . TUBAL LIGATION N/A 05/27/2016   Procedure: POST PARTUM TUBAL LIGATION;  Surgeon: Levie Heritage, DO;  Location: [facility];  Service: Gynecology;  Laterality: N/A;     OB History    Gravida  7   Para  4   Term  3   Preterm  1   AB  3   Living  4     SAB  2   TAB  1   Ectopic  0   Multiple  0   Live Births  4            Home Medications    Prior to Admission medications   Medication  Sig Start Date End Date Taking? Authorizing Provider  LORazepam (ATIVAN) 1 MG tablet Take 1 tablet (1 mg total) by mouth 2 (two) times daily. 09/13/18   Vanetta Mulders, MD  potassium chloride SA (K-DUR,KLOR-CON) 20 MEQ tablet Take 1 tablet (20 mEq total) by mouth 2 (two) times daily. 09/13/18   Vanetta Mulders, MD  prenatal vitamin w/FE, FA (PRENATAL 1 + 1) 27-1 MG TABS tablet Take 1 tablet by mouth daily at 12 noon. 02/10/16   Adline Potter, NP    Family History Family History  Problem Relation Age of Onset  . Hypertension Mother   . Cancer Mother   . Asthma Son   . Cancer Maternal Grandmother   . Other Maternal Grandmother        blood clots  . Hypertension Maternal Grandmother   . Cancer Maternal Grandfather     Social History Social History   Tobacco Use  . Smoking status: Current Every Day Smoker    Years: 20.00    Types: Cigarettes  . Smokeless tobacco: Never Used  . Tobacco comment: smokes 2-3 cig daily  Substance Use Topics  . Alcohol use: No    Comment: not now  .  Drug use: No     Allergies   Onion; Other; and Tomato   Review of Systems Review of Systems  Constitutional: Negative for fever.  HENT: Positive for congestion.   Eyes: Negative for photophobia and visual disturbance.  Respiratory: Positive for cough. Negative for shortness of breath.   Cardiovascular: Negative for chest pain.  Gastrointestinal: Negative for abdominal pain.  Genitourinary: Negative for dysuria.  Musculoskeletal: Negative for myalgias.  Skin: Negative for rash.  Neurological: Positive for numbness. Negative for dizziness, seizures, syncope, facial asymmetry, speech difficulty, weakness, light-headedness and headaches.  Hematological: Does not bruise/bleed easily.  Psychiatric/Behavioral: Negative for confusion.     Physical Exam Updated Vital Signs BP (!) 125/100   Pulse 74   Temp (!) 97.5 F (36.4 C) (Oral)   Resp 15   LMP 09/13/2018 Comment: Negative HCG in ED    SpO2 99%   Physical Exam  Constitutional: She is oriented to person, place, and time. She appears well-developed and well-nourished. No distress.  HENT:  Head: Normocephalic and atraumatic.  Mouth/Throat: Oropharynx is clear and moist.  Eyes: Pupils are equal, round, and reactive to light. Conjunctivae and EOM are normal.  Neck: Neck supple.  Cardiovascular: Normal rate, regular rhythm and normal heart sounds.  Pulmonary/Chest: Effort normal and breath sounds normal.  Abdominal: Soft. Bowel sounds are normal. There is no tenderness.  Musculoskeletal: Normal range of motion. She exhibits no edema.  Neurological: She is alert and oriented to person, place, and time. No cranial nerve deficit or sensory deficit. She exhibits normal muscle tone. Coordination normal.  Skin: Skin is warm. No rash noted. No erythema.  Nursing note and vitals reviewed.    ED Treatments / Results  Labs (all labs ordered are listed, but only abnormal results are displayed) Labs Reviewed  CBC - Abnormal; Notable for the following components:      Result Value   HCT 46.2 (*)    All other components within normal limits  COMPREHENSIVE METABOLIC PANEL - Abnormal; Notable for the following components:   Sodium 133 (*)    Potassium 2.6 (*)    Glucose, Bld 132 (*)    BUN <5 (*)    AST 132 (*)    ALT 112 (*)    All other components within normal limits  CBG MONITORING, ED - Abnormal; Notable for the following components:   Glucose-Capillary 150 (*)    All other components within normal limits  I-STAT BETA HCG BLOOD, ED ([facility], [facility], [facility] ONLY)    EKG EKG Interpretation  Date/Time:  Friday September 13 2018 18:23:24 EDT Ventricular Rate:  103 PR Interval:    QRS Duration: 84 QT Interval:  337 QTC Calculation: 442 R Axis:   51 Text Interpretation:  Sinus tachycardia Consider right atrial enlargement Abnormal R-wave progression, early transition Confirmed by Vanetta Mulders [phone]) on 09/13/2018 6:56:04  PM   Radiology Dg Chest 2 View  Result Date: 09/13/2018 CLINICAL DATA:  Productive cough for several days.  URI. EXAM: CHEST - 2 VIEW COMPARISON:  None. FINDINGS: The heart size and mediastinal contours are within normal limits. Both lungs are clear. The visualized skeletal structures are unremarkable. IMPRESSION: Negative chest. Electronically Signed   By: Marnee Spring M.D.   On: 09/13/2018 19:41   Ct Head Wo Contrast  Result Date: 09/13/2018 CLINICAL DATA:  Lower extremity numbness EXAM: CT HEAD WITHOUT CONTRAST TECHNIQUE: Contiguous axial images were obtained from the base of the skull through the vertex without intravenous contrast. COMPARISON:  None. FINDINGS: Brain: Mild generalized volume loss. No sign of old or acute focal infarction, mass lesion, hemorrhage, hydrocephalus or extra-axial collection. Vascular: No abnormal vascular finding. Skull: Normal Sinuses/Orbits: Mild mucosal thickening of the right division of the sphenoid sinus. Other sinuses clear. Other: None IMPRESSION: No acute finding. No cause of lower extremity numbness identified. Mild generalized volume loss. Electronically Signed   By: Paulina Fusi M.D.   On: 09/13/2018 19:51    Procedures Procedures (including critical care time)  Medications Ordered in ED Medications  0.9 %  sodium chloride infusion ( Intravenous New Bag/Given 09/13/18 1831)  potassium chloride SA (K-DUR,KLOR-CON) CR tablet 40 mEq (has no administration in time range)  LORazepam (ATIVAN) injection 1 mg (1 mg Intravenous Given 09/13/18 1832)     Initial Impression / Assessment and Plan / ED Course  I have reviewed the triage vital signs and the nursing notes.  Pertinent labs & imaging results that were available during my care of the patient were reviewed by me and considered in my medical decision making (see chart for details).    Patient symptoms seem to be consistent with some anxiety.  However numbness coming and going in strange  places can be related to multiple sclerosis.  Patient's lab work-up showed low potassium at 2.6.  But normal renal function.  Patient was early on in her course in ED was given some Ativan and the numbness all resolved.  Giving credence to the fact that this may have been anxiety.  Since patient is young and healthy and feel that she does not require admission for the potassium at 2.6.  Given 40 mEq of potassium here and will be continued on 20 mEq of potassium twice a day for the next 4 days.  She will follow-up with health department or return here to fast track to have her potassium rechecked.  Also given a list of foods that are high in potassium.  Patient also given a short course of Ativan.  She will return for any new or worse symptoms.  Patient's head CT was negative for any acute findings.   Final Clinical Impressions(s) / ED Diagnoses   Final diagnoses:  Numbness  Anxiety  Hypokalemia    ED Discharge Orders         Ordered    potassium chloride SA (K-DUR,KLOR-CON) 20 MEQ tablet  2 times daily     09/13/18 2053    LORazepam (ATIVAN) 1 MG tablet  2 times daily     09/13/18 2053           Vanetta Mulders, MD 09/13/18 2112

## 2018-09-13 NOTE — ED Triage Notes (Signed)
Pt started c/o numbness below knees at 1530 today. States started getting worse with numbness to left elbow and right side of neck. Pt anxious. Comfort measures given. Denies pain. States vomited x 1 pta. Denies trouble swallowing.

## 2018-09-13 NOTE — ED Notes (Signed)
Date and time results received: 09/13/2018  20:15 Test: Potassium Critical Value: 2.6  Name of Provider Notified: Deretha Emory MD  Orders Received? Or Actions Taken?: Physician notified

## 2018-10-07 ENCOUNTER — Other Ambulatory Visit: Payer: Self-pay

## 2018-10-07 ENCOUNTER — Encounter (HOSPITAL_COMMUNITY): Payer: Self-pay | Admitting: *Deleted

## 2018-10-07 ENCOUNTER — Emergency Department (HOSPITAL_COMMUNITY)
Admission: EM | Admit: 2018-10-07 | Discharge: 2018-10-08 | Disposition: A | Discharge status: Home / Self Care | Payer: Medicaid Other | Attending: Emergency Medicine | Admitting: Emergency Medicine

## 2018-10-07 DIAGNOSIS — F17210 Nicotine dependence, cigarettes, uncomplicated: Secondary | ICD-10-CM | POA: Diagnosis not present

## 2018-10-07 DIAGNOSIS — R040 Epistaxis: Secondary | ICD-10-CM | POA: Diagnosis not present

## 2018-10-07 DIAGNOSIS — Z79899 Other long term (current) drug therapy: Secondary | ICD-10-CM | POA: Insufficient documentation

## 2018-10-07 DIAGNOSIS — R58 Hemorrhage, not elsewhere classified: Secondary | ICD-10-CM | POA: Diagnosis not present

## 2018-10-07 MED ORDER — TRANEXAMIC ACID 1000 MG/10ML IV SOLN
500.0000 mg | Freq: Once | INTRAVENOUS | Status: AC
  Administered 2018-10-08: 500 mg via TOPICAL
  Filled 2018-10-07 (×2): qty 10

## 2018-10-07 NOTE — ED Provider Notes (Signed)
[facility] EMERGENCY DEPARTMENT Provider Note   CSN: [id number] Arrival date & time: 10/07/18  2315  Time seen 23:30 PM    History   Chief Complaint Chief Complaint  Patient presents with  . Epistaxis    HPI Makayla Colon is a 37 y.o. female.  HPI patient states 2:45 AM yesterday, November tests she was talking to the police when they arrested her daughter and she had acute onset of epistaxis from the right side.  She states it was easy to control.  She was fine all day.  Tonight about 10 PM she was getting ready to go to bed and she had acute onset of bleeding again from the right side of her nose.  She denies any precipitating event such as sneezing or coughing before hand.  She states she was laying down and she felt choked and when she stood up the blood started coming out from her nose.  She denies being on any type of blood thinners including aspirin, she denies a history of hypertension.  She states she is never had this before.  She does admit to being under stress because of the events of last night.  She denies any drug use.  Patient was transported by EMS who sprayed her nostrils with Afrin.  PCP  Patient, No Pcp Per    Past Medical History:  Diagnosis Date  . AMA (advanced maternal age) multigravida 35+ 02/10/2016  . Anemia   . Pregnant 02/10/2016  . Smoker 02/10/2016    Patient Active Problem List   Diagnosis Date Noted  . Active labor 05/26/2016  . History of preterm delivery, currently pregnant 03/08/2016  . Late prenatal care affecting pregnancy 03/08/2016  . History of prior pregnancy with IUGR newborn 03/08/2016  . Supervision of normal pregnancy 02/23/2016  . Smoker 02/10/2016  . AMA (advanced maternal age) multigravida 35+ 02/10/2016    Past Surgical History:  Procedure Laterality Date  . TUBAL LIGATION N/A 05/27/2016   Procedure: POST PARTUM TUBAL LIGATION;  Surgeon: Levie Heritage, DO;  Location: [facility];  Service: Gynecology;  Laterality: N/A;      OB History    Gravida  7   Para  4   Term  3   Preterm  1   AB  3   Living  4     SAB  2   TAB  1   Ectopic  0   Multiple  0   Live Births  4            Home Medications    Prior to Admission medications   Medication Sig Start Date End Date Taking? Authorizing Provider  LORazepam (ATIVAN) 1 MG tablet Take 1 tablet (1 mg total) by mouth 2 (two) times daily. 09/13/18   Vanetta Mulders, MD  potassium chloride SA (K-DUR,KLOR-CON) 20 MEQ tablet Take 1 tablet (20 mEq total) by mouth 2 (two) times daily. 09/13/18   Vanetta Mulders, MD  prenatal vitamin w/FE, FA (PRENATAL 1 + 1) 27-1 MG TABS tablet Take 1 tablet by mouth daily at 12 noon. 02/10/16   Adline Potter, NP    Family History Family History  Problem Relation Age of Onset  . Hypertension Mother   . Cancer Mother   . Asthma Son   . Cancer Maternal Grandmother   . Other Maternal Grandmother        blood clots  . Hypertension Maternal Grandmother   . Cancer Maternal Grandfather     Social  History Social History   Tobacco Use  . Smoking status: Current Every Day Smoker    Years: 20.00    Types: Cigarettes  . Smokeless tobacco: Never Used  . Tobacco comment: smokes 2-3 cig daily  Substance Use Topics  . Alcohol use: No    Comment: not now  . Drug use: No     Allergies   Onion; Other; and Tomato   Review of Systems Review of Systems  All other systems reviewed and are negative.    Physical Exam Updated Vital Signs BP (!) 146/103 (BP Location: Left Arm)   Pulse 79   Temp 98.2 F (36.8 C) (Oral)   Resp 18   Ht 5\' 5"  (1.651 m)   Wt 52.2 kg   LMP 09/13/2018 Comment: Negative HCG in ED   SpO2 99%   BMI 19.14 kg/m   Vital signs normal except diastolic hypertension   Physical Exam  Constitutional: She is oriented to person, place, and time.  Very petite female who is holding gauze over her nose.  HENT:  Head: Normocephalic and atraumatic.  Right Ear: External ear  normal.  Left Ear: External ear normal.  Mouth/Throat: Oropharynx is clear and moist.  When I inspect her nares patient has blood in both nares.  She is not actively bleeding though.  Eyes: Pupils are equal, round, and reactive to light. Conjunctivae and EOM are normal.  Neck: Normal range of motion.  Cardiovascular: Normal rate.  Pulmonary/Chest: Effort normal. No stridor.  Musculoskeletal: Normal range of motion.  Neurological: She is alert and oriented to person, place, and time. No cranial nerve deficit.  Skin: Skin is warm and dry. No rash noted.  Psychiatric: She has a normal mood and affect. Her behavior is normal. Thought content normal.  Nursing note and vitals reviewed.    ED Treatments / Results  Labs (all labs ordered are listed, but only abnormal results are displayed) Labs Reviewed - No data to display  EKG None  Radiology No results found.  Procedures Procedures (including critical care time)  Medications Ordered in ED Medications  tranexamic acid (CYKLOKAPRON) injection 500 mg (500 mg Topical Given 10/08/18 0002)     Initial Impression / Assessment and Plan / ED Course  I have reviewed the triage vital signs and the nursing notes.  Pertinent labs & imaging results that were available during my care of the patient were reviewed by me and considered in my medical decision making (see chart for details).     12 midnight I sprayed TXA on a cottonball in place in her right nostril.  She currently is not having any active bleeding i.e. she denies feeling like she is having blood running down her throat and she is not having any active blood coming out the anterior nares.  Recheck at 1 AM there is a small amount of blood on the cottonball which was removed.  I reexamined her right nares and I do not see an obvious site where the bleeding is originating from.  Another pledget with TXA was inserted in her nostril.  Patient was given the leftover TXA to use at home  with some cotton balls.  Her blood pressure was repeated and was 138/90.  Final Clinical Impressions(s) / ED Diagnoses   Final diagnoses:  Right-sided epistaxis    ED Discharge Orders    None     Plan discharge  Devoria Albe, MD, Concha Pyo, MD 10/08/18 [phone]

## 2018-10-07 NOTE — ED Triage Notes (Signed)
Pt brought in by [facility] for c/o nosebleed; pt states she had one yesterday as well and has been under a lot of stress; ems administered 2 sprays of afrin in each nostril en route to ED; pt denies any pain

## 2018-10-08 ENCOUNTER — Other Ambulatory Visit: Payer: Self-pay

## 2018-10-08 NOTE — Discharge Instructions (Addendum)
You can remove the cottonball from your nostril after 2 AM.  Try not to sneeze or blow your nose.  Try to remain calm and relaxed.  If it should start bleeding again pinch her nose like a) for 5 to 10 minutes.  If it still bleeding put some of the liquid medicine on the cottonball in place in your nostril that is bleeding.  If it continues to bleed please return to the emergency department.  If you continue to have nosebleed problems please be evaluated by Dr. Suszanne Connerseoh, a ears nose and throat specialist.

## 2018-10-11 ENCOUNTER — Other Ambulatory Visit: Payer: Self-pay

## 2018-10-11 ENCOUNTER — Emergency Department (HOSPITAL_COMMUNITY)
Admission: EM | Admit: 2018-10-11 | Discharge: 2018-10-11 | Disposition: A | Discharge status: Home / Self Care | Payer: Medicaid Other | Attending: Emergency Medicine | Admitting: Emergency Medicine

## 2018-10-11 ENCOUNTER — Encounter (HOSPITAL_COMMUNITY): Payer: Self-pay

## 2018-10-11 ENCOUNTER — Emergency Department (HOSPITAL_COMMUNITY)
Admission: EM | Admit: 2018-10-11 | Discharge: 2018-10-11 | Disposition: A | Discharge status: Home / Self Care | Payer: Medicaid Other | Source: Home / Self Care | Attending: Emergency Medicine | Admitting: Emergency Medicine

## 2018-10-11 DIAGNOSIS — R040 Epistaxis: Secondary | ICD-10-CM | POA: Insufficient documentation

## 2018-10-11 DIAGNOSIS — F17210 Nicotine dependence, cigarettes, uncomplicated: Secondary | ICD-10-CM

## 2018-10-11 DIAGNOSIS — Z79899 Other long term (current) drug therapy: Secondary | ICD-10-CM | POA: Insufficient documentation

## 2018-10-11 NOTE — ED Notes (Signed)
Pt ambulatory to waiting room. Pt verbalized understanding of discharge instructions.   

## 2018-10-11 NOTE — Discharge Instructions (Addendum)
Leave nasal packing in place until you are able to follow-up with Dr. Jenne PaneBates with ENT, please call today to schedule follow-up appointment in his office, and make sure tell them you had a Rhino Rocket placed in the emergency department today.

## 2018-10-11 NOTE — ED Notes (Signed)
Bleeding controlled at this time.

## 2018-10-11 NOTE — ED Provider Notes (Signed)
[facility] EMERGENCY DEPARTMENT Provider Note   CSN: [id number] Arrival date & time: 10/11/18  0230     History   Chief Complaint Chief Complaint  Patient presents with  . Epistaxis    HPI Makayla Colon is a 37 y.o. female.  The history is provided by the patient.  Epistaxis   This is a new problem. The current episode started 1 to 2 hours ago. The problem occurs constantly. The problem has been resolved. The problem is associated with an unknown factor. The bleeding has been from the right nare. She has tried nothing for the symptoms. Her past medical history does not include bleeding disorder.  Presents for nosebleed.  She had this several days ago that resolved.  Tonight it began bleeding again from her right nare.  Denies fevers or vomiting.  No worsening lightheadedness. She does not Take anticoagulants  Past Medical History:  Diagnosis Date  . AMA (advanced maternal age) multigravida 35+ 02/10/2016  . Anemia   . Pregnant 02/10/2016  . Smoker 02/10/2016    Patient Active Problem List   Diagnosis Date Noted  . Active labor 05/26/2016  . History of preterm delivery, currently pregnant 03/08/2016  . Late prenatal care affecting pregnancy 03/08/2016  . History of prior pregnancy with IUGR newborn 03/08/2016  . Supervision of normal pregnancy 02/23/2016  . Smoker 02/10/2016  . AMA (advanced maternal age) multigravida 35+ 02/10/2016    Past Surgical History:  Procedure Laterality Date  . TUBAL LIGATION N/A 05/27/2016   Procedure: POST PARTUM TUBAL LIGATION;  Surgeon: Levie HeritageJacob J Stinson, DO;  Location: [facility];  Service: Gynecology;  Laterality: N/A;     OB History    Gravida  7   Para  4   Term  3   Preterm  1   AB  3   Living  4     SAB  2   TAB  1   Ectopic  0   Multiple  0   Live Births  4            Home Medications    Prior to Admission medications   Medication Sig Start Date End Date Taking? Authorizing Provider  LORazepam (ATIVAN) 1  MG tablet Take 1 tablet (1 mg total) by mouth 2 (two) times daily. 09/13/18   Vanetta MuldersZackowski, Scott, MD  potassium chloride SA (K-DUR,KLOR-CON) 20 MEQ tablet Take 1 tablet (20 mEq total) by mouth 2 (two) times daily. 09/13/18   Vanetta MuldersZackowski, Scott, MD  prenatal vitamin w/FE, FA (PRENATAL 1 + 1) 27-1 MG TABS tablet Take 1 tablet by mouth daily at 12 noon. 02/10/16   Adline PotterGriffin, Jennifer A, NP    Family History Family History  Problem Relation Age of Onset  . Hypertension Mother   . Cancer Mother   . Asthma Son   . Cancer Maternal Grandmother   . Other Maternal Grandmother        blood clots  . Hypertension Maternal Grandmother   . Cancer Maternal Grandfather     Social History Social History   Tobacco Use  . Smoking status: Current Every Day Smoker    Years: 20.00    Types: Cigarettes  . Smokeless tobacco: Never Used  . Tobacco comment: smokes 2-3 cig daily  Substance Use Topics  . Alcohol use: No    Comment: not now  . Drug use: No     Allergies   Onion; Other; and Tomato   Review of Systems Review of Systems  Constitutional: Negative for fever.  HENT: Positive for nosebleeds.   Gastrointestinal: Negative for vomiting.  All other systems reviewed and are negative.    Physical Exam Updated Vital Signs BP (!) 114/95 (BP Location: Left Arm)   Pulse (!) 109   Temp 98.1 F (36.7 C) (Oral)   Resp 15   Ht 1.651 m (5\' 5" )   Wt 52.2 kg   LMP 09/13/2018 Comment: Negative HCG in ED   SpO2 100%   BMI 19.14 kg/m   Physical Exam  CONSTITUTIONAL: Well developed/well nourished HEAD: Normocephalic/atraumatic EYES: EOMI/PERRL ENMT: Mucous membranes moist, dried blood noted to right nare, erythema noted to anterior right septum, no hematoma, no active bleeding in oropharynx NECK: supple no meningeal signs CV: S1/S2 noted, no murmurs/rubs/gallops noted LUNGS: Lungs are clear to auscultation bilaterally, no apparent distress ABDOMEN: soft  NEURO: Pt is awake/alert/appropriate,  moves all extremitiesx4.  No facial droop.   EXTREMITIES: pulses normal/equal, full ROM SKIN: warm, color normal PSYCH: no abnormalities of mood noted, alert and oriented to situation  ED Treatments / Results  Labs (all labs ordered are listed, but only abnormal results are displayed) Labs Reviewed - No data to display  EKG None  Radiology No results found.  Procedures .Epistaxis Management Date/Time: 10/11/2018 3:30 AM Performed by: Zadie Rhine, MD Authorized by: Zadie Rhine, MD   Consent:    Consent obtained:  Verbal   Consent given by:  Patient   Alternatives discussed:  No treatment Anesthesia (see MAR for exact dosages):    Anesthesia method:  Topical application   Topical anesthesia: Lidocaine with epinephrine. Procedure details:    Treatment site:  R anterior   Treatment method:  Anterior pack   Treatment complexity:  Limited   Treatment episode: recurring   Post-procedure details:    Assessment:  Bleeding stopped   Patient tolerance of procedure:  Tolerated well, no immediate complications    Medications Ordered in ED Medications - No data to display   Initial Impression / Assessment and Plan / ED Course  I have reviewed the triage vital signs and the nursing notes.       She presents after epistaxis. Seen in the ER several days ago for similar episode that has resolved.  Tonight she had another episode of bleeding, she reports copious amounts of blood.  At this time bleeding is controlled, but she still is high risk for recurrent bleeding.  Since this is patient's second ER visit for epistaxis she failed outpatient management as well as TXA, she agreed to receive anterior nasal packing. Tolerated this well.  Will defer antibiotics, will refer to ENT.  We discussed strict return precautions including fever, foul-smelling drainage from nose, or any severe pain.  Final Clinical Impressions(s) / ED Diagnoses   Final diagnoses:  Right-sided  epistaxis    ED Discharge Orders    None       Zadie Rhine, MD 10/11/18 [phone]

## 2018-10-11 NOTE — ED Triage Notes (Signed)
Pt reports that nasal packing is coming out of nose.Pt was seen during the night for nose bleed

## 2018-10-11 NOTE — ED Provider Notes (Signed)
[facility] EMERGENCY DEPARTMENT Provider Note   CSN: [id number] Arrival date & time: 10/11/18  1230     History   Chief Complaint Chief Complaint  Patient presents with  . Epistaxis    HPI Makayla Colon is a 37 y.o. female.  Makayla Colon is a 37 y.o. Female with a history of anemia, who presents to the emergency department for evaluation of nosebleed in the right nare.Marland Kitchen  She was initially seen here in the emergency department on Monday of this week for epistaxis, and then had a recurrent nosebleed and was seen here in the emergency department overnight, and had a Rhino Rocket placed, she returns today because when she got up this morning and started doing stuff around the house her packing seem to start to slide out and this afternoon she started having bleeding around the packing once again.  She reports she thinks it may have deflated.  She has not been able to follow-up with ENT yet.  Denies any lightheadedness or dizziness, no chest pain or shortness of breath.  No headaches.  Patient is not on any anticoagulants.     Past Medical History:  Diagnosis Date  . AMA (advanced maternal age) multigravida 35+ 02/10/2016  . Anemia   . Pregnant 02/10/2016  . Smoker 02/10/2016    Patient Active Problem List   Diagnosis Date Noted  . Active labor 05/26/2016  . History of preterm delivery, currently pregnant 03/08/2016  . Late prenatal care affecting pregnancy 03/08/2016  . History of prior pregnancy with IUGR newborn 03/08/2016  . Supervision of normal pregnancy 02/23/2016  . Smoker 02/10/2016  . AMA (advanced maternal age) multigravida 35+ 02/10/2016    Past Surgical History:  Procedure Laterality Date  . TUBAL LIGATION N/A 05/27/2016   Procedure: POST PARTUM TUBAL LIGATION;  Surgeon: Levie Heritage, DO;  Location: [facility];  Service: Gynecology;  Laterality: N/A;     OB History    Gravida  7   Para  4   Term  3   Preterm  1   AB  3   Living  4     SAB  2   TAB  1   Ectopic  0   Multiple  0   Live Births  4            Home Medications    Prior to Admission medications   Medication Sig Start Date End Date Taking? Authorizing Provider  LORazepam (ATIVAN) 1 MG tablet Take 1 tablet (1 mg total) by mouth 2 (two) times daily. 09/13/18   Vanetta Mulders, MD  potassium chloride SA (K-DUR,KLOR-CON) 20 MEQ tablet Take 1 tablet (20 mEq total) by mouth 2 (two) times daily. 09/13/18   Vanetta Mulders, MD  prenatal vitamin w/FE, FA (PRENATAL 1 + 1) 27-1 MG TABS tablet Take 1 tablet by mouth daily at 12 noon. 02/10/16   Adline Potter, NP    Family History Family History  Problem Relation Age of Onset  . Hypertension Mother   . Cancer Mother   . Asthma Son   . Cancer Maternal Grandmother   . Other Maternal Grandmother        blood clots  . Hypertension Maternal Grandmother   . Cancer Maternal Grandfather     Social History Social History   Tobacco Use  . Smoking status: Current Every Day Smoker    Years: 20.00    Types: Cigarettes  . Smokeless tobacco: Never Used  . Tobacco comment: smokes  2-3 cig daily  Substance Use Topics  . Alcohol use: No    Comment: not now  . Drug use: No     Allergies   Onion; Other; and Tomato   Review of Systems Review of Systems  Constitutional: Negative for chills and fever.  Respiratory: Negative for shortness of breath.   Cardiovascular: Negative for chest pain and palpitations.  Skin: Negative for color change and rash.  Neurological: Negative for dizziness, syncope and light-headedness.     Physical Exam Updated Vital Signs BP (!) 134/97   Pulse (!) 101   Temp 98.3 F (36.8 C)   Resp 20   Ht 5\' 5"  (1.651 m)   Wt 52 kg   LMP 09/13/2018 Comment: Negative HCG in ED   SpO2 100%   BMI 19.08 kg/m   Physical Exam  Constitutional: She appears well-developed and well-nourished. No distress.  HENT:  Head: Normocephalic and atraumatic.  Nasal packing present in the right  nare, this is almost entirely come out and appears deflated, it is bloodsoaked, patient is not currently having any active bleeding around the packing but reports this occurred earlier.  Packing removed with large clot which came out behind it and then patient began having active bleeding from the nose.  Eyes: Right eye exhibits no discharge. Left eye exhibits no discharge.  Pulmonary/Chest: Effort normal. No respiratory distress.  Neurological: She is alert. Coordination normal.  Skin: Skin is warm and dry. Capillary refill takes less than 2 seconds. She is not diaphoretic.  Psychiatric: She has a normal mood and affect. Her behavior is normal.  Nursing note and vitals reviewed.    ED Treatments / Results  Labs (all labs ordered are listed, but only abnormal results are displayed) Labs Reviewed - No data to display  EKG None  Radiology No results found.  Procedures .Epistaxis Management Date/Time: 10/11/2018 3:10 PM Performed by: Dartha LodgeFord, Roselynn Whitacre N, PA-C Authorized by: Dartha LodgeFord, Skarleth Delmonico N, PA-C   Consent:    Consent obtained:  Verbal   Consent given by:  Patient   Risks discussed:  Bleeding, infection, nasal injury and pain   Alternatives discussed:  No treatment Anesthesia (see MAR for exact dosages):    Anesthesia method:  None Procedure details:    Treatment site:  R anterior   Treatment method:  Anterior pack   Treatment complexity:  Limited   Treatment episode: recurring   Post-procedure details:    Assessment:  Bleeding stopped   Patient tolerance of procedure:  Tolerated well, no immediate complications   (including critical care time)  Medications Ordered in ED Medications - No data to display   Initial Impression / Assessment and Plan / ED Course  I have reviewed the triage vital signs and the nursing notes.  Pertinent labs & imaging results that were available during my care of the patient were reviewed by me and considered in my medical decision making (see  chart for details).  Presents with recurrent nosebleed, had Rhino Rocket placed last night but this seems to have deflated and is coming out with some bleeding around it.  The old Rhino Rocket was removed, with large blood clot coming out behind it and some active bleeding, a new Rhino Rocket was placed and inflated, and good hemostasis was achieved.  Patient observed here in the emergency department for 30 minutes after with no additional bleeding.  Will have patient follow-up with ENT.  Return precautions discussed.  Patient expresses understanding and is in agreement with plan.  Stable for discharge home.  Final Clinical Impressions(s) / ED Diagnoses   Final diagnoses:  Epistaxis, recurrent    ED Discharge Orders    None       Dartha Lodge, [place] 10/11/18 1514    Samuel Jester, DO 10/14/18 2213

## 2018-10-11 NOTE — ED Triage Notes (Signed)
Pt reports nosebleed she was seen here on Monday, states started bleeding again tonight.

## 2018-10-14 DIAGNOSIS — R040 Epistaxis: Secondary | ICD-10-CM | POA: Diagnosis not present

## 2018-10-21 ENCOUNTER — Emergency Department (HOSPITAL_COMMUNITY)
Admission: EM | Admit: 2018-10-21 | Discharge: 2018-10-21 | Disposition: A | Discharge status: Home / Self Care | Payer: Medicaid Other | Attending: Emergency Medicine | Admitting: Emergency Medicine

## 2018-10-21 ENCOUNTER — Encounter (HOSPITAL_COMMUNITY): Payer: Self-pay | Admitting: Emergency Medicine

## 2018-10-21 DIAGNOSIS — S032XXA Dislocation of tooth, initial encounter: Secondary | ICD-10-CM | POA: Diagnosis not present

## 2018-10-21 DIAGNOSIS — Z79899 Other long term (current) drug therapy: Secondary | ICD-10-CM | POA: Diagnosis not present

## 2018-10-21 DIAGNOSIS — Y9389 Activity, other specified: Secondary | ICD-10-CM | POA: Diagnosis not present

## 2018-10-21 DIAGNOSIS — S01511A Laceration without foreign body of lip, initial encounter: Secondary | ICD-10-CM | POA: Diagnosis not present

## 2018-10-21 DIAGNOSIS — F17210 Nicotine dependence, cigarettes, uncomplicated: Secondary | ICD-10-CM | POA: Insufficient documentation

## 2018-10-21 DIAGNOSIS — Y998 Other external cause status: Secondary | ICD-10-CM | POA: Insufficient documentation

## 2018-10-21 DIAGNOSIS — Y92019 Unspecified place in single-family (private) house as the place of occurrence of the external cause: Secondary | ICD-10-CM | POA: Insufficient documentation

## 2018-10-21 DIAGNOSIS — W228XXA Striking against or struck by other objects, initial encounter: Secondary | ICD-10-CM | POA: Insufficient documentation

## 2018-10-21 DIAGNOSIS — S025XXA Fracture of tooth (traumatic), initial encounter for closed fracture: Secondary | ICD-10-CM | POA: Diagnosis not present

## 2018-10-21 DIAGNOSIS — S0993XA Unspecified injury of face, initial encounter: Secondary | ICD-10-CM | POA: Diagnosis present

## 2018-10-21 MED ORDER — LIDOCAINE-EPINEPHRINE (PF) 2 %-1:200000 IJ SOLN
10.0000 mL | Freq: Once | INTRAMUSCULAR | Status: DC
  Filled 2018-10-21: qty 10

## 2018-10-21 NOTE — Discharge Instructions (Addendum)
Provided his information on dentist in town. You were seen in the ER for dental fracture and dental avulsion.  We recommend that you are seen by a dentist as soon as possible.  Liquid diet is recommended until you are cleared by dentist.  Take ibuprofen for pain.  The sutures should dissolve on its own in 2 to 3 days.  To minimize scarring, we recommend that you protect your scar from sun exposure for at least the next 1 month.
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
## 2020-01-28 IMAGING — CT CT HEAD W/O CM
3 series · 16 of 47 positions shown, 19 images · non-contrast
Comparison: None.

CLINICAL DATA: Lower extremity numbness

EXAM:
CT HEAD WITHOUT CONTRAST
TECHNIQUE: Contiguous axial images were obtained from the base of the skull
through the vertex without intravenous contrast.

[Series 2: head trauma wo · axial · 0.38mm/px · z∈[-22,+103]mm · 10 of 31 slices shown, 13 images]
[im 3/31  brain]
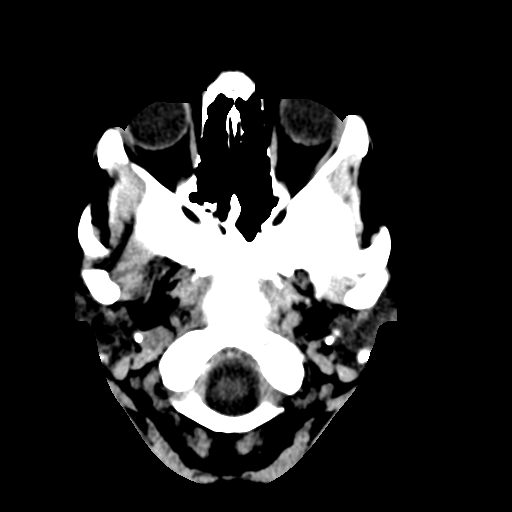
[im 3/31  bone]
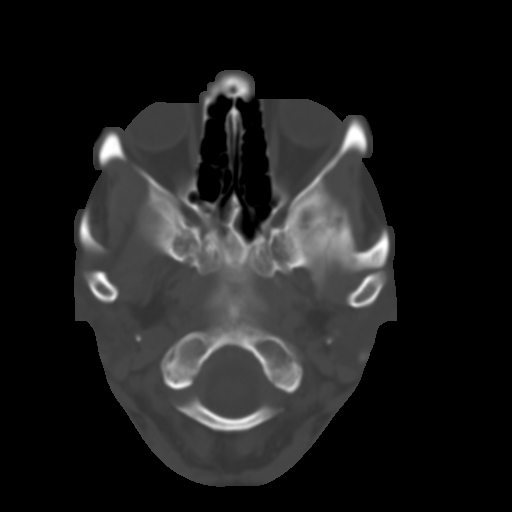
[im 6/31  brain]
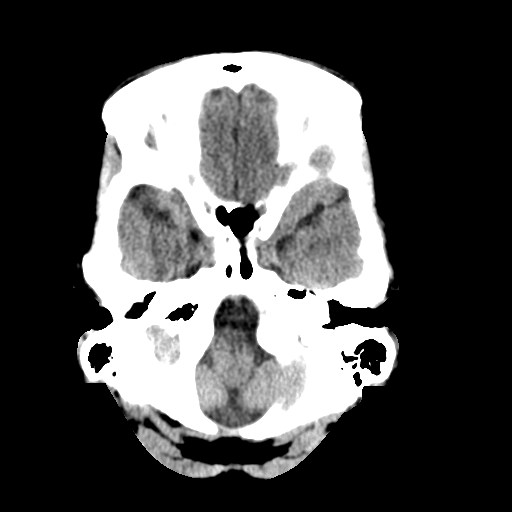
[im 9/31  brain]
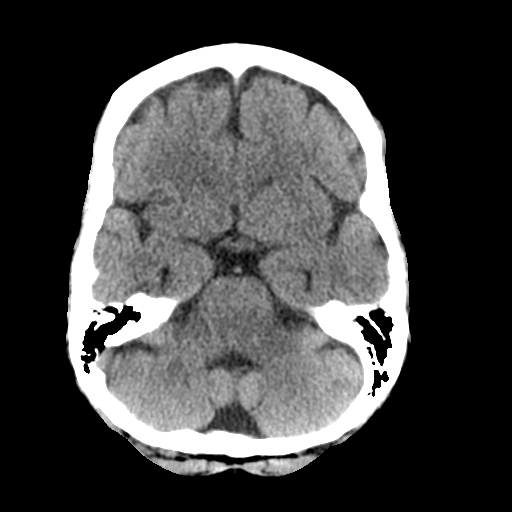
[im 11/31  brain]
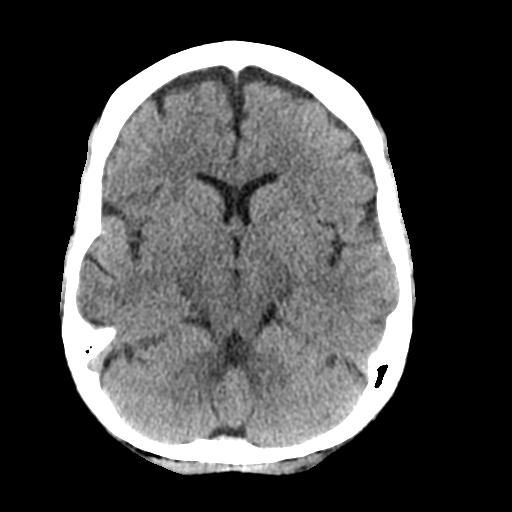
[im 14/31  brain]
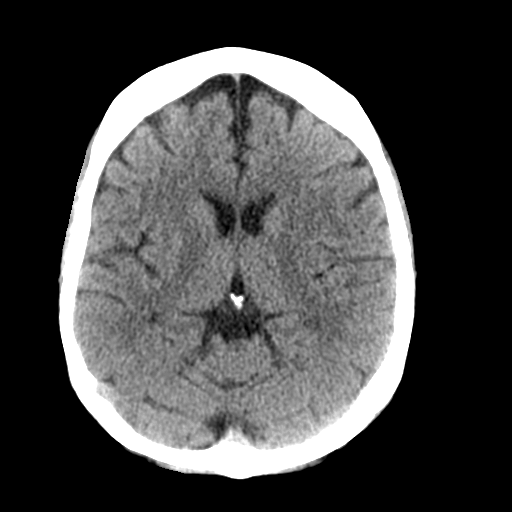
[im 14/31  bone]
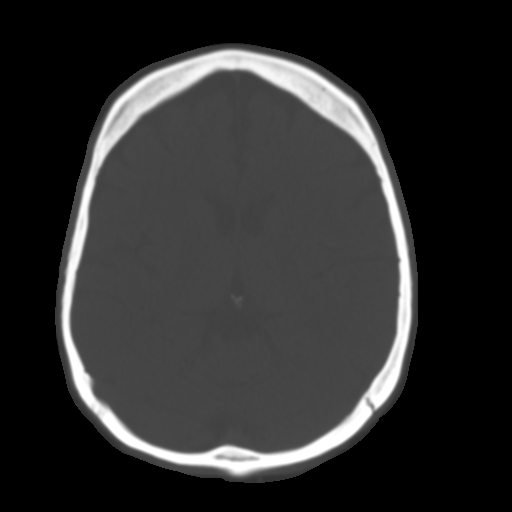
[im 17/31  brain]
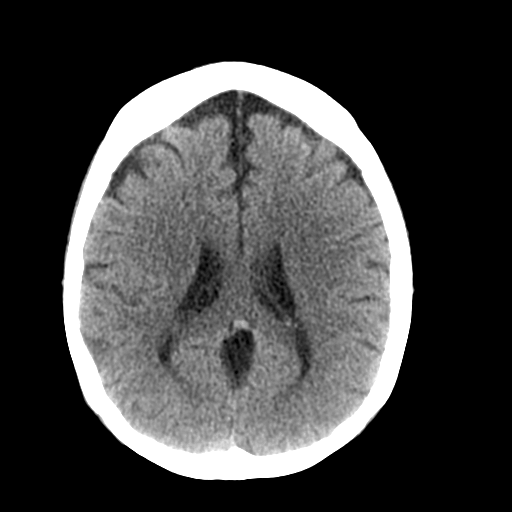
[im 20/31  brain]
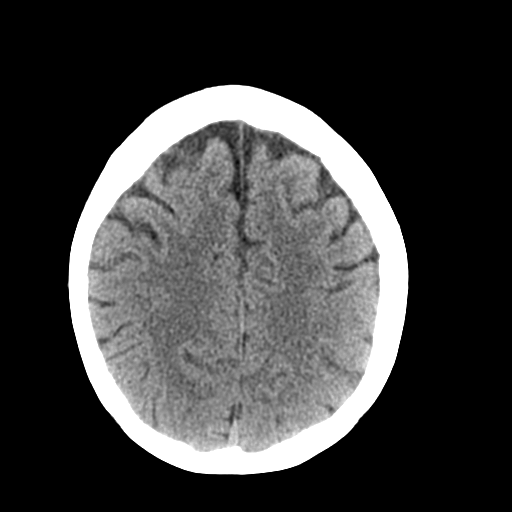
[im 23/31  brain]
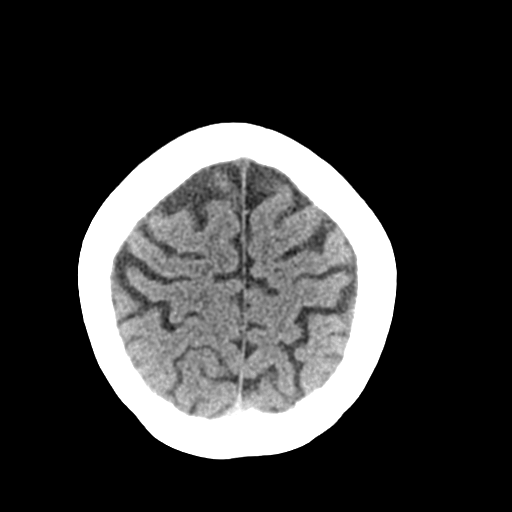
[im 25/31  brain]
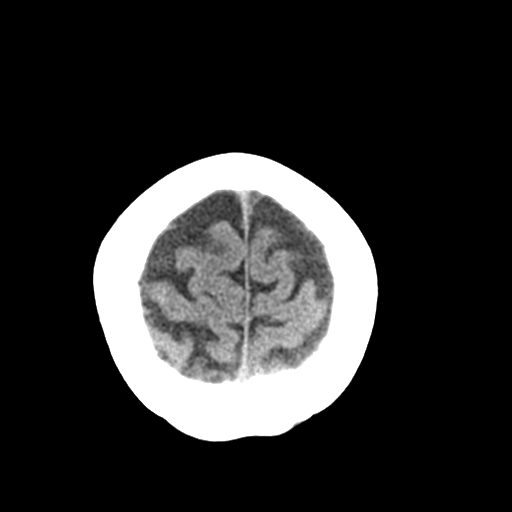
[im 25/31  bone]
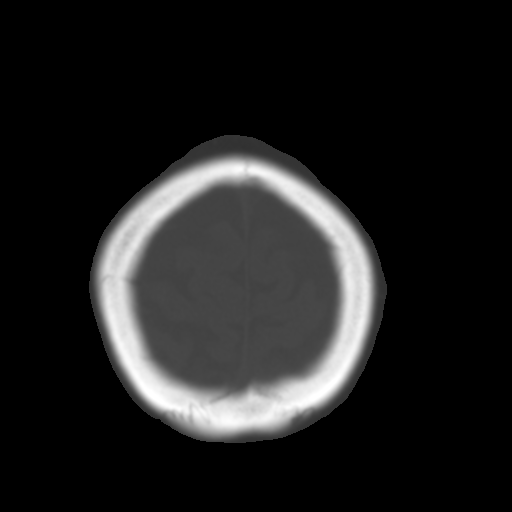
[im 28/31  brain]
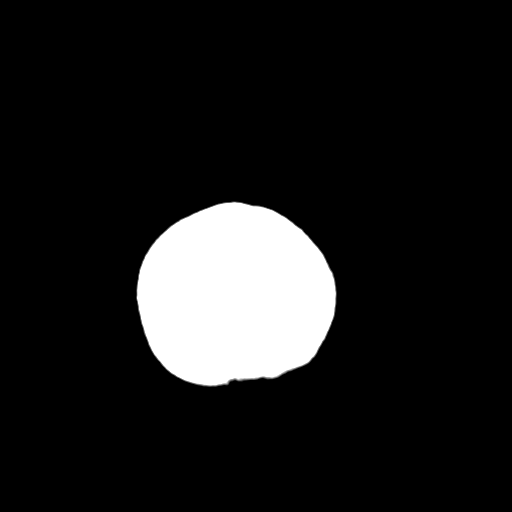

[Series 4: coronal soft tissue · coronal · 0.33mm/px · 3 of 62 slices shown]
[im 21/62  brain]
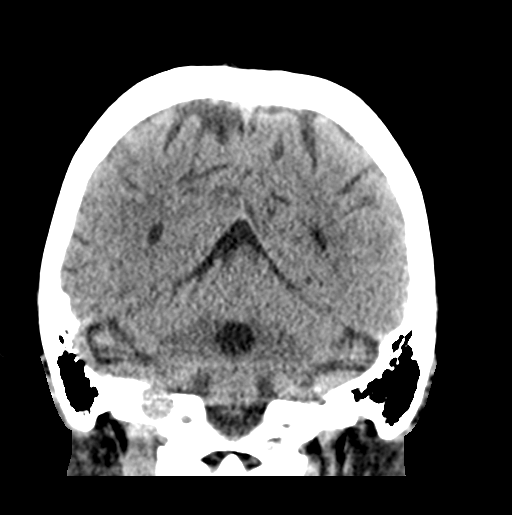
[im 28/62  brain]
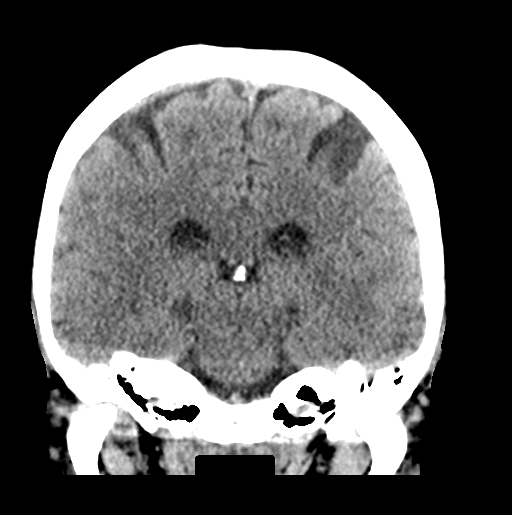
[im 34/62  brain]
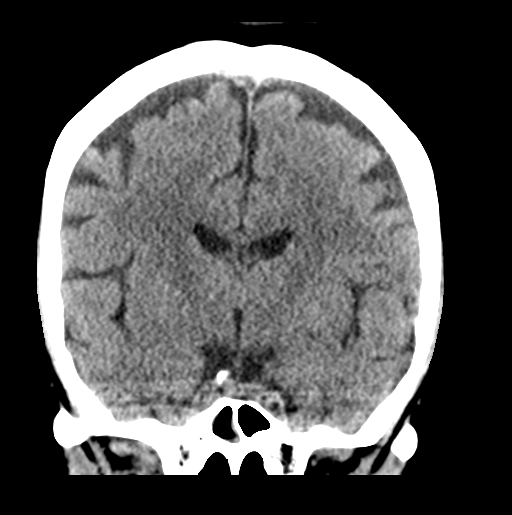

[Series 5: sagittal soft tissue · sagittal · 0.32mm/px · 3 of 57 slices shown]
[im 19/57  brain]
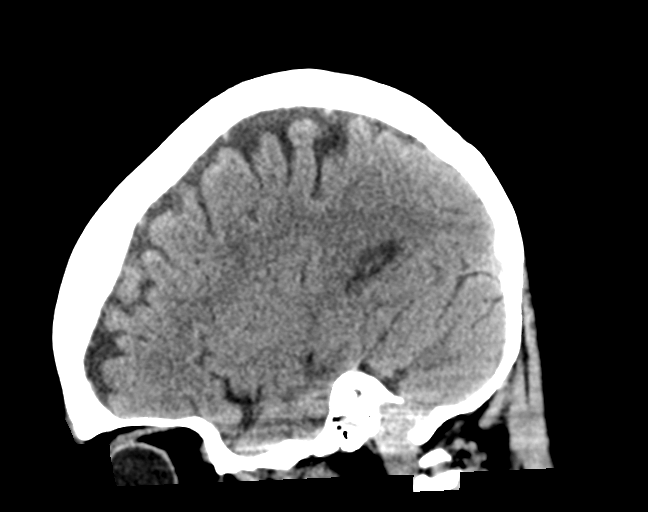
[im 29/57  brain]
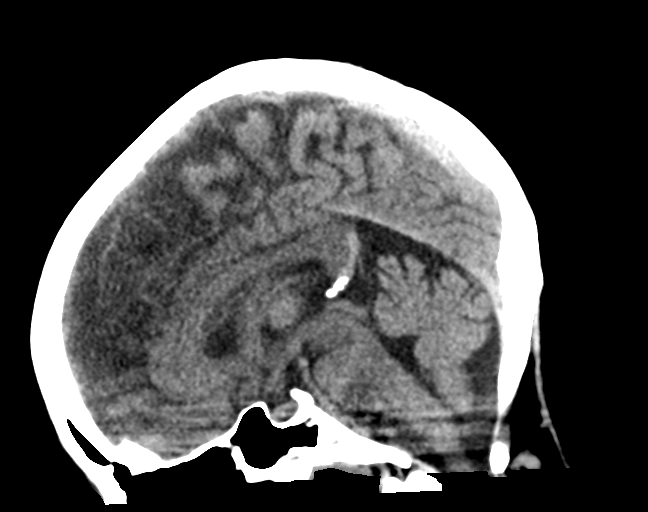
[im 38/57  brain]
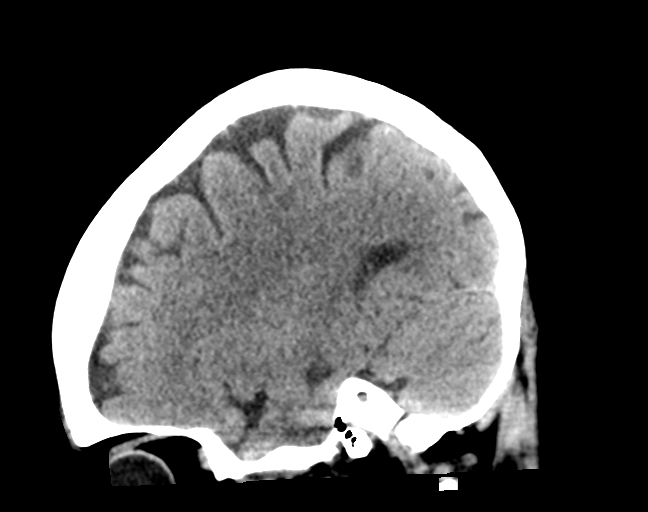

[16 of 47 positions shown; findings below may reference images not displayed]

FINDINGS: Brain: Mild generalized volume loss. No sign of old or acute focal
infarction, mass lesion, hemorrhage, hydrocephalus or extra-axial
collection.

Vascular: No abnormal vascular finding.

Skull: Normal

Sinuses/Orbits: Mild mucosal thickening of the right division of the
sphenoid sinus. Other sinuses clear.

Other: None
IMPRESSION: No acute finding. No cause of lower extremity numbness identified.
Mild generalized volume loss.

## 2020-01-28 IMAGING — DX DG CHEST 2V
2 series · 2 of 2 positions shown · non-contrast
Comparison: None.

CLINICAL DATA: Productive cough for several days.  URI.

EXAM:
CHEST - 2 VIEW

[chest lat]
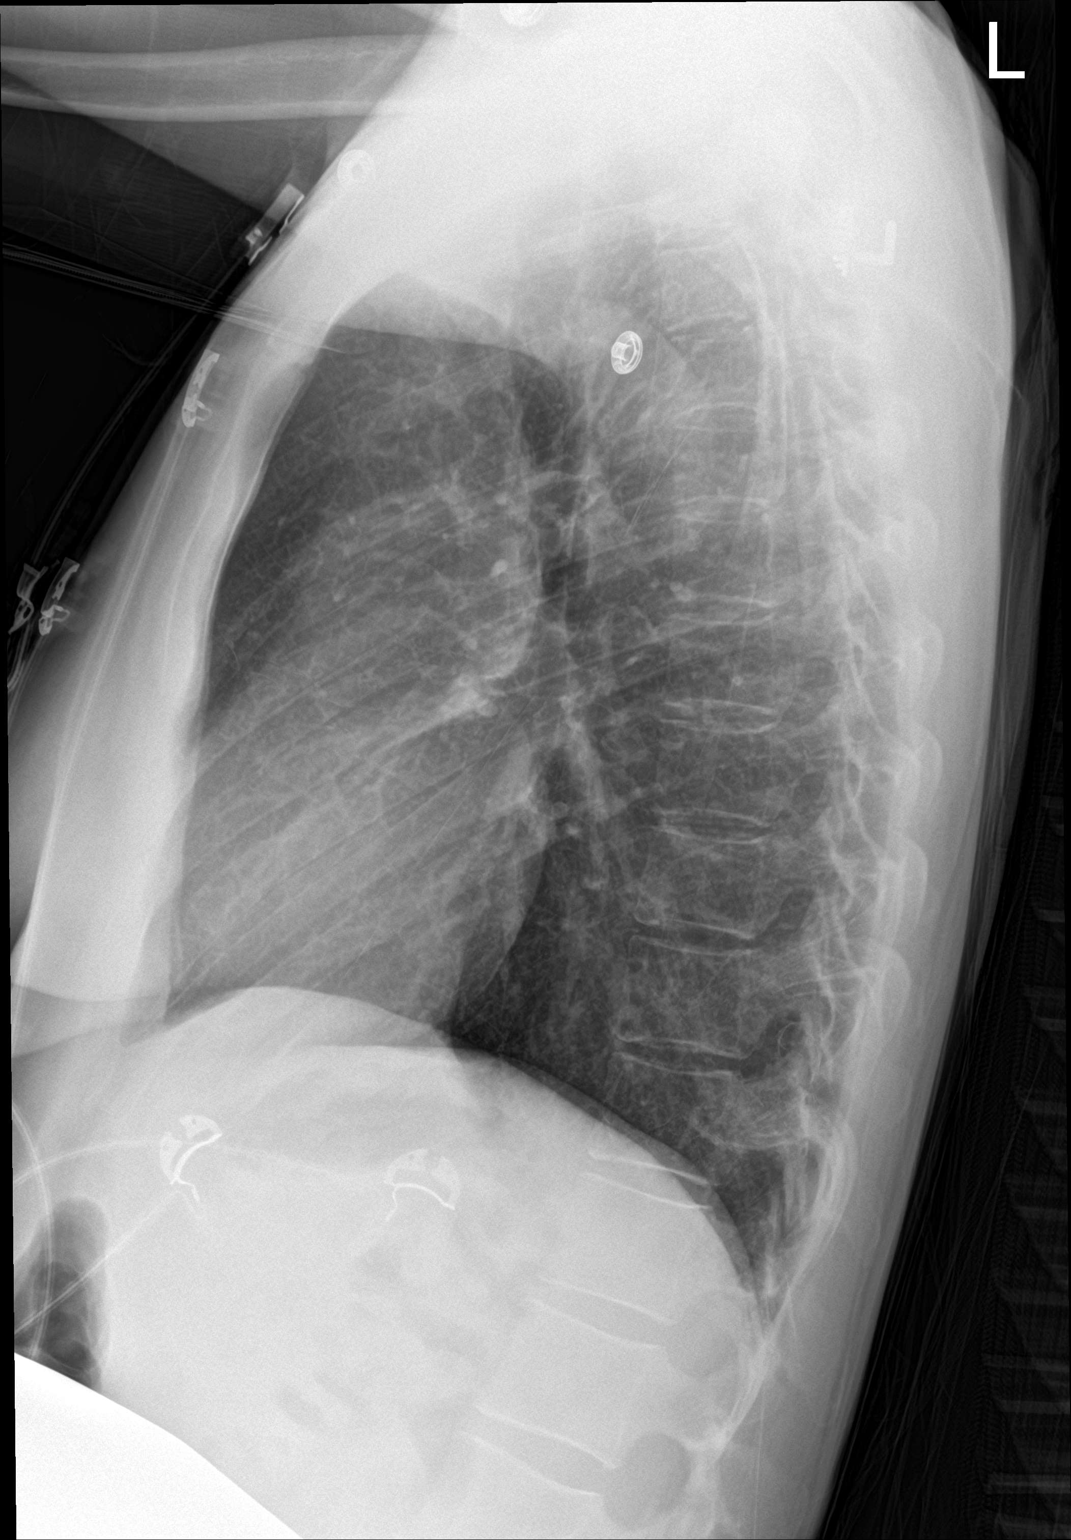

[chest ap]
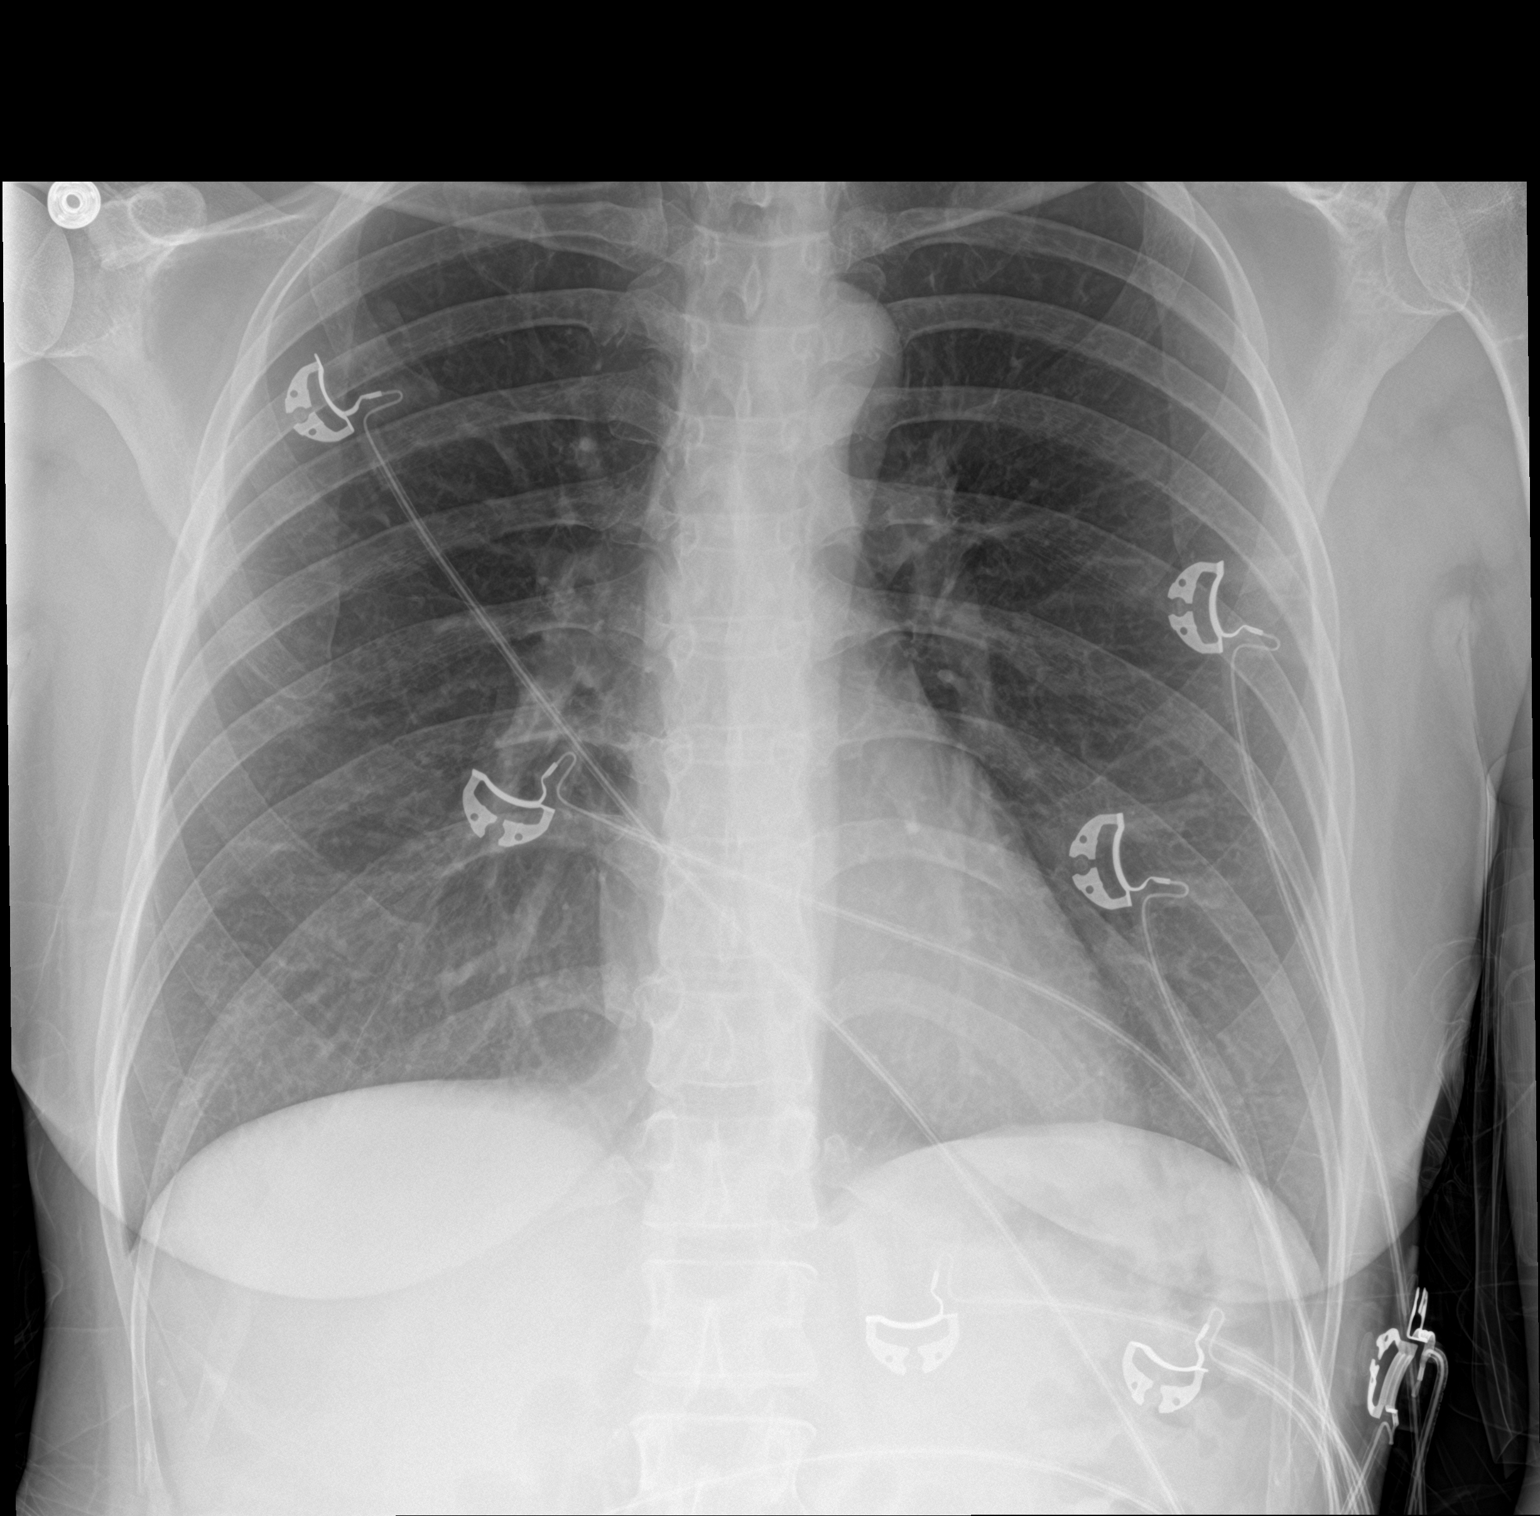

[2 of 2 positions shown; findings below may reference images not displayed]

FINDINGS: The heart size and mediastinal contours are within normal limits.
Both lungs are clear. The visualized skeletal structures are
unremarkable.
IMPRESSION: Negative chest.
